# Patient Record
Sex: Male | Born: 1950 | Race: White | Hispanic: No | Marital: Married | Smoking: Never smoker
Health system: Southern US, Community
[De-identification: ages and names within clinical notes are randomized; demographics above are authoritative.]

## PROBLEM LIST (undated history)

## (undated) DIAGNOSIS — I10 Essential (primary) hypertension: Secondary | ICD-10-CM

## (undated) DIAGNOSIS — K219 Gastro-esophageal reflux disease without esophagitis: Secondary | ICD-10-CM

## (undated) DIAGNOSIS — C801 Malignant (primary) neoplasm, unspecified: Secondary | ICD-10-CM

## (undated) DIAGNOSIS — D229 Melanocytic nevi, unspecified: Secondary | ICD-10-CM

## (undated) DIAGNOSIS — N189 Chronic kidney disease, unspecified: Secondary | ICD-10-CM

## (undated) DIAGNOSIS — R011 Cardiac murmur, unspecified: Secondary | ICD-10-CM

## (undated) HISTORY — PX: OTHER SURGICAL HISTORY: SHX169

---
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
## 1979-01-18 HISTORY — PX: VASECTOMY: SHX75

## 2001-02-12 DIAGNOSIS — D229 Melanocytic nevi, unspecified: Secondary | ICD-10-CM

## 2001-02-12 HISTORY — DX: Melanocytic nevi, unspecified: D22.9

## 2014-12-24 ENCOUNTER — Other Ambulatory Visit: Payer: Self-pay | Admitting: Urology

## 2015-01-13 NOTE — Patient Instructions (Addendum)
Bobby Schroeder  01/13/2015   Your procedure is scheduled on:  01/26/2015    Report to [facility] Main  Entrance take [place]  elevators to 3rd floor to  [place] at    [employer] AM.  Call this number if you have problems the morning of surgery [phone]   Remember: ONLY 1 PERSON MAY GO WITH YOU TO SHORT STAY TO GET  READY MORNING OF YOUR SURGERY.  Do not eat food after midnight Saturday night.  Sunday morning may follow clear liquid diet until midnight Sunday night.  Then nothing by mouth.   CLEAR LIQUID DIET   Foods Allowed                                                                     Foods Excluded  Coffee and tea, regular and decaf                             liquids that you cannot  Plain Jell-O in any flavor                                             see through such as: Fruit ices (not with fruit pulp)                                     milk, soups, orange juice  Iced Popsicles                                    All solid food Carbonated beverages, regular and diet                                    Cranberry, grape and apple juices Sports drinks like Gatorade Lightly seasoned clear broth or consume(fat free) Sugar, honey syrup  Sample Menu Breakfast                                Lunch                                     Supper Cranberry juice                    Beef broth                            Chicken broth Jell-O                                     Grape juice  Apple juice Coffee or tea                        Jell-O                                      Popsicle                                                Coffee or tea                        Coffee or tea  _____________________________________________________________________       Take these medicines the morning of surgery with A SIP OF WATER: Doxazosin (Cardura), Allopurinol                                You may not have any metal on your body  including hair pins and              piercings  Do not wear jewelry,  lotions, powders or perfumes, deodorant                         Men may shave face and neck.   Do not bring valuables to the hospital. [place].  Contacts, dentures or bridgework may not be worn into surgery.  Leave suitcase in the car. After surgery it may be brought to your room.   Marland Kitchen  Special Instructions: coughing and deep breathing exercises, leg exercises               Please read over the following fact sheets you were given: _____________________________________________________________________             [facility] - Preparing for Surgery Before surgery, you can play an important role.  Because skin is not sterile, your skin needs to be as free of germs as possible.  You can reduce the number of germs on your skin by washing with CHG (chlorahexidine gluconate) soap before surgery.  CHG is an antiseptic cleaner which kills germs and bonds with the skin to continue killing germs even after washing. Please DO NOT use if you have an allergy to CHG or antibacterial soaps.  If your skin becomes reddened/irritated stop using the CHG and inform your nurse when you arrive at Short Stay. Do not shave (including legs and underarms) for at least 48 hours prior to the first CHG shower.  You may shave your face/neck. Please follow these instructions carefully:  1.  Shower with CHG Soap the night before surgery and the  morning of Surgery.  2.  If you choose to wash your hair, wash your hair first as usual with your  normal  shampoo.  3.  After you shampoo, rinse your hair and body thoroughly to remove the  shampoo.                           4.  Use CHG as you would any other liquid soap.  You can apply chg directly  to the skin and wash                       Gently with a scrungie or clean washcloth.  5.  Apply the CHG Soap to your body ONLY FROM THE NECK DOWN.   Do not use  on face/ open                           Wound or open sores. Avoid contact with eyes, ears mouth and genitals (private parts).                       Wash face,  Genitals (private parts) with your normal soap.             6.  Wash thoroughly, paying special attention to the area where your surgery  will be performed.  7.  Thoroughly rinse your body with warm water from the neck down.  8.  DO NOT shower/wash with your normal soap after using and rinsing off  the CHG Soap.                9.  Pat yourself dry with a clean towel.            10.  Wear clean pajamas.            11.  Place clean sheets on your bed the night of your first shower and do not  sleep with pets. Day of Surgery : Do not apply any lotions/deodorants the morning of surgery.  Please wear clean clothes to the hospital/surgery center.  FAILURE TO FOLLOW THESE INSTRUCTIONS MAY RESULT IN THE CANCELLATION OF YOUR SURGERY PATIENT SIGNATURE_________________________________  NURSE SIGNATURE__________________________________  ________________________________________________________________________  WHAT IS A BLOOD TRANSFUSION? Blood Transfusion Information  A transfusion is the replacement of blood or some of its parts. Blood is made up of multiple cells which provide different functions.  Red blood cells carry oxygen and are used for blood loss replacement.  White blood cells fight against infection.  Platelets control bleeding.  Plasma helps clot blood.  Other blood products are available for specialized needs, such as hemophilia or other clotting disorders. BEFORE THE TRANSFUSION  Who gives blood for transfusions?   Healthy volunteers who are fully evaluated to make sure their blood is safe. This is blood bank blood. Transfusion therapy is the safest it has ever been in the practice of medicine. Before blood is taken from a donor, a complete history is taken to make sure that person has no history of diseases nor engages  in risky social behavior (examples are intravenous drug use or sexual activity with multiple partners). The donor's travel history is screened to minimize risk of transmitting infections, such as malaria. The donated blood is tested for signs of infectious diseases, such as HIV and hepatitis. The blood is then tested to be sure it is compatible with you in order to minimize the chance of a transfusion reaction. If you or a relative donates blood, this is often done in anticipation of surgery and is not appropriate for emergency situations. It takes many days to process the donated blood. RISKS AND COMPLICATIONS Although transfusion therapy is very safe and saves many lives, the main dangers of transfusion include:  1. Getting an infectious disease. 2. Developing a transfusion reaction. This is an allergic reaction to something in the blood you were  given. Every precaution is taken to prevent this. The decision to have a blood transfusion has been considered carefully by your caregiver before blood is given. Blood is not given unless the benefits outweigh the risks. AFTER THE TRANSFUSION  Right after receiving a blood transfusion, you will usually feel much better and more energetic. This is especially true if your red blood cells have gotten low (anemic). The transfusion raises the level of the red blood cells which carry oxygen, and this usually causes an energy increase.  The nurse administering the transfusion will monitor you carefully for complications. HOME CARE INSTRUCTIONS  No special instructions are needed after a transfusion. You may find your energy is better. Speak with your caregiver about any limitations on activity for underlying diseases you may have. SEEK MEDICAL CARE IF:   Your condition is not improving after your transfusion.  You develop redness or irritation at the intravenous (IV) site. SEEK IMMEDIATE MEDICAL CARE IF:  Any of the following symptoms occur over the next 12  hours:  Shaking chills.  You have a temperature by mouth above 102 F (38.9 C), not controlled by medicine.  Chest, back, or muscle pain.  People around you feel you are not acting correctly or are confused.  Shortness of breath or difficulty breathing.  Dizziness and fainting.  You get a rash or develop hives.  You have a decrease in urine output.  Your urine turns a dark color or changes to pink, red, or brown. Any of the following symptoms occur over the next 10 days:  You have a temperature by mouth above 102 F (38.9 C), not controlled by medicine.  Shortness of breath.  Weakness after normal activity.  The white part of the eye turns yellow (jaundice).  You have a decrease in the amount of urine or are urinating less often.  Your urine turns a dark color or changes to pink, red, or brown. Document Released: 01/01/2000 Document Revised: 03/28/2011 Document Reviewed: 08/20/2007 ExitCare Patient Information 2014 [place].  _______________________________________________________________________  Incentive Spirometer  An incentive spirometer is a tool that can help keep your lungs clear and active. This tool measures how well you are filling your lungs with each breath. Taking long deep breaths may help reverse or decrease the chance of developing breathing (pulmonary) problems (especially infection) following:  A long period of time when you are unable to move or be active. BEFORE THE PROCEDURE   If the spirometer includes an indicator to show your best effort, your nurse or respiratory therapist will set it to a desired goal.  If possible, sit up straight or lean slightly forward. Try not to slouch.  Hold the incentive spirometer in an upright position. INSTRUCTIONS FOR USE  3. Sit on the edge of your bed if possible, or sit up as far as you can in bed or on a chair. 4. Hold the incentive spirometer in an upright position. 5. Breathe out  normally. 6. Place the mouthpiece in your mouth and seal your lips tightly around it. 7. Breathe in slowly and as deeply as possible, raising the piston or the ball toward the top of the column. 8. Hold your breath for 3-5 seconds or for as long as possible. Allow the piston or ball to fall to the bottom of the column. 9. Remove the mouthpiece from your mouth and breathe out normally. 10. Rest for a few seconds and repeat Steps 1 through 7 at least 10 times every 1-2 hours when you  are awake. Take your time and take a few normal breaths between deep breaths. 11. The spirometer may include an indicator to show your best effort. Use the indicator as a goal to work toward during each repetition. 12. After each set of 10 deep breaths, practice coughing to be sure your lungs are clear. If you have an incision (the cut made at the time of surgery), support your incision when coughing by placing a pillow or rolled up towels firmly against it. Once you are able to get out of bed, walk around indoors and cough well. You may stop using the incentive spirometer when instructed by your caregiver.  RISKS AND COMPLICATIONS  Take your time so you do not get dizzy or light-headed.  If you are in pain, you may need to take or ask for pain medication before doing incentive spirometry. It is harder to take a deep breath if you are having pain. AFTER USE  Rest and breathe slowly and easily.  It can be helpful to keep track of a log of your progress. Your caregiver can provide you with a simple table to help with this. If you are using the spirometer at home, follow these instructions: [place] IF:   You are having difficultly using the spirometer.  You have trouble using the spirometer as often as instructed.  Your pain medication is not giving enough relief while using the spirometer.  You develop fever of 100.5 F (38.1 C) or higher. SEEK IMMEDIATE MEDICAL CARE IF:   You cough up bloody sputum  that had not been present before.  You develop fever of 102 F (38.9 C) or greater.  You develop worsening pain at or near the incision site. MAKE SURE YOU:   Understand these instructions.  Will watch your condition.  Will get help right away if you are not doing well or get worse. Document Released: 05/16/2006 Document Revised: 03/28/2011 Document Reviewed: 07/17/2006 [facility] Patient Information 2014 [place], [place].   ________________________________________________________________________

## 2015-01-14 ENCOUNTER — Ambulatory Visit (HOSPITAL_COMMUNITY)
Admission: RE | Admit: 2015-01-14 | Discharge: 2015-01-14 | Disposition: A | Discharge status: Home / Self Care | Payer: Medicare Other | Source: Ambulatory Visit | Attending: Anesthesiology | Admitting: Anesthesiology

## 2015-01-14 ENCOUNTER — Encounter (HOSPITAL_COMMUNITY)
Admission: RE | Admit: 2015-01-14 | Discharge: 2015-01-14 | Disposition: A | Discharge status: Home / Self Care | Payer: Medicare Other | Source: Ambulatory Visit | Attending: Urology | Admitting: Urology

## 2015-01-14 ENCOUNTER — Encounter (HOSPITAL_COMMUNITY): Payer: Self-pay

## 2015-01-14 ENCOUNTER — Other Ambulatory Visit (HOSPITAL_COMMUNITY): Payer: Self-pay

## 2015-01-14 DIAGNOSIS — Z01818 Encounter for other preprocedural examination: Secondary | ICD-10-CM | POA: Diagnosis not present

## 2015-01-14 HISTORY — DX: Cardiac murmur, unspecified: R01.1

## 2015-01-14 HISTORY — DX: Malignant (primary) neoplasm, unspecified: C80.1

## 2015-01-14 HISTORY — DX: Gastro-esophageal reflux disease without esophagitis: K21.9

## 2015-01-14 HISTORY — DX: Chronic kidney disease, unspecified: N18.9

## 2015-01-14 HISTORY — DX: Essential (primary) hypertension: I10

## 2015-01-14 LAB — CBC
HCT: 44.3 % (ref 39.0–52.0)
Hemoglobin: 15 g/dL (ref 13.0–17.0)
MCH: 30.9 pg (ref 26.0–34.0)
MCHC: 33.9 g/dL (ref 30.0–36.0)
MCV: 91.2 fL (ref 78.0–100.0)
Platelets: 168 10*3/uL (ref 150–400)
RBC: 4.86 MIL/uL (ref 4.22–5.81)
RDW: 12.3 % (ref 11.5–15.5)
WBC: 4.3 10*3/uL (ref 4.0–10.5)

## 2015-01-14 LAB — TYPE AND SCREEN
ABO/RH(D): O POS
Antibody Screen: NEGATIVE

## 2015-01-14 LAB — BASIC METABOLIC PANEL
Anion gap: 9 (ref 5–15)
BUN: 12 mg/dL (ref 6–20)
CALCIUM: 9.3 mg/dL (ref 8.9–10.3)
CHLORIDE: 105 mmol/L (ref 101–111)
CO2: 28 mmol/L (ref 22–32)
CREATININE: 1.14 mg/dL (ref 0.61–1.24)
Glucose, Bld: 100 mg/dL — ABNORMAL HIGH (ref 65–99)
Potassium: 4 mmol/L (ref 3.5–5.1)
SODIUM: 142 mmol/L (ref 135–145)

## 2015-01-14 LAB — ABO/RH: ABO/RH(D): O POS

## 2015-01-14 NOTE — Progress Notes (Signed)
11-07-14 - Whole Body Bone Scan - [facility] - in chart 11-07-14 - CT ABD/Pelvis - [facility] - in chart

## 2015-01-23 NOTE — H&P (Signed)
Chief Complaint Prostate Cancer   Reason For Visit Reason for consult: To discuss treatment options for prostate cancer and specifically to consider a robotic prostatectomy.  Physician requesting consult: Dr. Roxanne Gates and Dr. Nila Nephew  PCP: Dr. Emelda Fear   History of Present Illness Bobby Schroeder is a 65 year old gentleman who was found to have an elevated PSA of 6.04 prompting urologic referral to Dr. Lerry Liner. He performed a TRUS biopsy of the prostate on 10/28/14 that confirmed Gleason 3+3=6 adenocarcinoma of the prostate in 7 out of 12 biopsy cores. Dr. Lerry Liner performed staging studies including a CT scan and bone scan on 11/07/14 which were negative for metastatic disease. His CT did incidentally note non-obstructing renal calculi. He has a family history of prostate cancer with his father having died of metastatic disease at age 81. He has been counseled by Dr. Lerry Liner and Dr. Danny Lawless and is well informed about his treatment options.    TNM stage: cT1c Nx Mx  PSA: 6.04  Gleason score: 3+3=6  Biopsy (10/28/14 - read by Dr. Casimer Schroeder, Acc # [id number]): 7/12 cores    Left: L apex (20%, 3+3=6, 1/2 cores), L mid (5%, 5%, 3+3=6)    Right: R apex (60%, 10%, 3+3=6), R mid (20%, 3+3=6), R base (5%, 3+3=6)  Prostate volume: 32.8 cc    Nomogram  OC disease: 45%  EPE: 54%  SVI: 3%  LNI: 2%  PFS (surgery): 92% at 5 years, 85% at 10 years    Urinary function: He does have bothersome symptoms including intermittency, weak stream, and straining. He has a history of BPH and has been treated with doxazosin 8 mg for a few years. He feels that his medication has become less effective over the past year. IPSS is 18 with a bother score of 4.  Erectile function: SHIM score is 3. This is primarily related to inactivity. He states that his sexual activity has been a very low priority for him and his wife for the past few years.   Past Medical History Problems  1. History of BPH  (benign prostatic hyperplasia) (N40.0) 2. History of gout (Z87.39) 3. History of hypertension (Z86.79) 4. History of urinary stone [id number])  Surgical History Problems  1. History of Renal Lithotripsy 2. History of Surgery Vas Deferens Vasectomy  Current Meds 1. Allopurinol 300 MG Oral Tablet;  Therapy: (Recorded:06Dec2016) to Recorded 2. Doxazosin Mesylate 8 MG Oral Tablet;  Therapy: (Recorded:06Dec2016) to Recorded 3. Tamsulosin HCl - 0.4 MG Oral Capsule;  Therapy: (Recorded:06Dec2016) to Recorded  Allergies Medication  1. No Known Drug Allergies  Family History Problems  1. Family history of prostate cancer (Z80.42) : Father  Social History Problems    Denied: History of Alcohol use   Married   Never a smoker  Review of Systems Genitourinary, constitutional, skin, eye, otolaryngeal, hematologic/lymphatic, cardiovascular, pulmonary, endocrine, musculoskeletal, gastrointestinal, neurological and psychiatric system(s) were reviewed and pertinent findings if present are noted and are otherwise negative.  Gastrointestinal: heartburn, diarrhea and constipation.    Vitals Vital Signs [Data Includes: Last 1 Day]  Recorded: [id number] 09:29AM  Height: 5 ft 9 in Weight: 170 lb  BMI Calculated: 25.1 BSA Calculated: 1.93 Blood Pressure: 147 / 79 Heart Rate: 71  Physical Exam Constitutional: Well nourished and well developed . No acute distress.  ENT:. The ears and nose are normal in appearance.  Neck: The appearance of the neck is normal and no neck mass is present.  Pulmonary: No respiratory distress, normal respiratory  rhythm and effort and clear bilateral breath sounds.  Cardiovascular: Heart rate and rhythm are normal . No peripheral edema.  Abdomen: The abdomen is soft and nontender. No masses are palpated. No CVA tenderness. No hernias are palpable. No hepatosplenomegaly noted.  Rectal: Rectal exam demonstrates normal sphincter tone, no tenderness and no masses.  Prostate size is estimated to be 40 g. The prostate has no nodularity and is not tender. The left seminal vesicle is nonpalpable. The right seminal vesicle is nonpalpable. The perineum is normal on inspection.  Lymphatics: The femoral and inguinal nodes are not enlarged or tender.  Skin: Normal skin turgor, no visible rash and no visible skin lesions.  Neuro/Psych:. Mood and affect are appropriate.    Results/Data Urine [Data Includes: Last 1 Day]   [id number]  COLOR YELLOW   APPEARANCE CLEAR   SPECIFIC GRAVITY 1.025   pH 5.5   GLUCOSE NEGATIVE   BILIRUBIN NEGATIVE   KETONE NEGATIVE   BLOOD 1+   PROTEIN NEGATIVE   NITRITE NEGATIVE   LEUKOCYTE ESTERASE NEGATIVE   SQUAMOUS EPITHELIAL/HPF 0-5 HPF  WBC 0-5 WBC/HPF  RBC 0-2 RBC/HPF  BACTERIA NONE SEEN HPF  CRYSTALS NONE SEEN HPF  CASTS NONE SEEN LPF  Other    Yeast NONE SEEN HPF   I have reviewed his medical records, PSA results, and pathology results. Findings are as dictated above.   Plan Health Maintenance  1. UA With REFLEX; [Do Not Release]; Status:Complete;   [id number] 09:21AM  Discussion/Summary 1. Prostate cancer: Bobby Schroeder feels very well informed about his treatment options. After further discussion, he is most interested in proceeding with surgical therapy primarily due to his baseline urinary symptoms.   The patient was counseled about the natural history of prostate cancer and the standard treatment options that are available for prostate cancer. It was explained to him how his age and life expectancy, clinical stage, Gleason score, and PSA affect his prognosis, the decision to proceed with additional staging studies, as well as how that information influences recommended treatment strategies. We discussed the roles for active surveillance, radiation therapy, surgical therapy, androgen deprivation, as well as ablative therapy options for the treatment of prostate cancer as appropriate to his individual cancer  situation. We discussed the risks and benefits of these options with regard to their impact on cancer control and also in terms of potential adverse events, complications, and impact on quiality of life particularly related to urinary, bowel, and sexual function. The patient was encouraged to ask questions throughout the discussion today and all questions were answered to his stated satisfaction. In addition, the patient was provided with and/or directed to appropriate resources and literature for further education about prostate cancer and treatment options.   We discussed surgical therapy for prostate cancer including the different available surgical approaches. We discussed, in detail, the risks and expectations of surgery with regard to cancer control, urinary control, and erectile function as well as the expected postoperative recovery process. Additional risks of surgery including but not limited to bleeding, infection, hernia formation, nerve damage, lymphocele formation, bowel/rectal injury potentially necessitating colostomy, damage to the urinary tract resulting in urine leakage, urethral stricture, and the cardiopulmonary risks such as myocardial infarction, stroke, death, venothromboembolism, etc. were explained. The risk of open surgical conversion for robotic/laparoscopic prostatectomy was also discussed.     He will tentatively be scheduled for a bilateral nerve sparing robot-assisted laparoscopic radical prostatectomy.    Cc: Dr. Emelda Fear  Dr. Nila Nephew  Dr. Alain Marion  A total of 65 minutes were spent in the overall care of the patient today with 45 minutes in direct face to face consultation.    Signatures Electronically signed by : Raynelle Bring, M.D.; Dec 23 2014 10:31AM EST  Chief Complaint Prostate Cancer    History of Present Illness Bobby Schroeder is a 65 year old gentleman who was found to have an elevated PSA of 6.04 prompting urologic referral to Dr. Lerry Liner.  He performed a TRUS biopsy of the prostate on 10/28/14 that confirmed Gleason 3+3=6 adenocarcinoma of the prostate in 7 out of 12 biopsy cores. Dr. Lerry Liner performed staging studies including a CT scan and bone scan on 11/07/14 which were negative for metastatic disease. His CT did incidentally note non-obstructing renal calculi. He has a family history of prostate cancer with his father having died of metastatic disease at age 85. He has been counseled by Dr. Lerry Liner and Dr. Danny Lawless and is well informed about his treatment options.    TNM stage: cT1c Nx Mx  PSA: 6.04  Gleason score: 3+3=6  Biopsy (10/28/14 - read by Dr. Casimer Schroeder, Acc # [id number]): 7/12 cores    Left: L apex (20%, 3+3=6, 1/2 cores), L mid (5%, 5%, 3+3=6)    Right: R apex (60%, 10%, 3+3=6), R mid (20%, 3+3=6), R base (5%, 3+3=6)  Prostate volume: 32.8 cc    Nomogram  OC disease: 45%  EPE: 54%  SVI: 3%  LNI: 2%  PFS (surgery): 92% at 5 years, 85% at 10 years    Urinary function: He does have bothersome symptoms including intermittency, weak stream, and straining. He has a history of BPH and has been treated with doxazosin 8 mg for a few years. He feels that his medication has become less effective over the past year. IPSS is 18 with a bother score of 4.  Erectile function: SHIM score is 3. This is primarily related to inactivity. He states that his sexual activity has been a very low priority for him and his wife for the past few years.   Past Medical History Problems  1. History of BPH (benign prostatic hyperplasia) (N40.0) 2. History of gout (Z87.39) 3. History of hypertension (Z86.79) 4. History of urinary stone [id number])  Surgical History Problems  1. History of Renal Lithotripsy 2. History of Surgery Vas Deferens Vasectomy  Current Meds 1. Allopurinol 300 MG Oral Tablet;  Therapy: (Recorded:06Dec2016) to Recorded 2. Doxazosin Mesylate 8 MG Oral Tablet;  Therapy: (Recorded:06Dec2016) to  Recorded 3. Tamsulosin HCl - 0.4 MG Oral Capsule;  Therapy: (Recorded:06Dec2016) to Recorded  Allergies Medication  1. No Known Drug Allergies  Family History Problems  1. Family history of prostate cancer (Z80.42) : Father  Social History Problems    Denied: History of Alcohol use   Married   Never a smoker  Review of Systems Genitourinary, constitutional, skin, eye, otolaryngeal, hematologic/lymphatic, cardiovascular, pulmonary, endocrine, musculoskeletal, gastrointestinal, neurological and psychiatric system(s) were reviewed and pertinent findings if present are noted and are otherwise negative.  Gastrointestinal: heartburn, diarrhea and constipation.    Vitals  Height: 5 ft 9 in Weight: 170 lb  BMI Calculated: 25.1   Physical Exam Constitutional: Well nourished and well developed . No acute distress.  ENT:. The ears and nose are normal in appearance.  Neck: The appearance of the neck is normal and no neck mass is present.  Pulmonary: No respiratory distress, normal respiratory rhythm and effort and clear bilateral breath sounds.  Cardiovascular: Heart  rate and rhythm are normal . No peripheral edema.  Abdomen: The abdomen is soft and nontender. No masses are palpated. No CVA tenderness. No hernias are palpable. No hepatosplenomegaly noted.    Discussion/Summary 1. Prostate cancer: Bobby Schroeder will undergo a bilateral nerve sparing robot-assisted laparoscopic radical prostatectomy.

## 2015-01-25 NOTE — Anesthesia Preprocedure Evaluation (Addendum)
Anesthesia Evaluation  Patient identified by MRN, date of birth, ID band Patient awake    Reviewed: Allergy & Precautions, NPO status , Patient's Chart, lab work & pertinent test results  Airway Mallampati: II  TM Distance: >3 FB Neck ROM: Full    Dental  (+) Dental Advisory Given   Pulmonary neg pulmonary ROS,    breath sounds clear to auscultation       Cardiovascular hypertension,  Rhythm:Regular Rate:Normal     Neuro/Psych negative neurological ROS     GI/Hepatic Neg liver ROS, GERD  ,  Endo/Other  negative endocrine ROS  Renal/GU Renal InsufficiencyRenal disease     Musculoskeletal   Abdominal   Peds  Hematology negative hematology ROS (+)   Anesthesia Other Findings   Reproductive/Obstetrics                               Component Value Date/Time   WBC 4.3 01/14/2015 1205   RBC 4.86 01/14/2015 1205   HGB 15.0 01/14/2015 1205   HCT 44.3 01/14/2015 1205   PLT 168 01/14/2015 1205      Component Value Date/Time   NA 142 01/14/2015 1205   K 4.0 01/14/2015 1205   CL 105 01/14/2015 1205   CO2 28 01/14/2015 1205   BUN 12 01/14/2015 1205   CREATININE 1.14 01/14/2015 1205   CALCIUM 9.3 01/14/2015 1205      Anesthesia Physical Anesthesia Plan  ASA: II  Anesthesia Plan: General   Post-op Pain Management:    Induction: Intravenous  Airway Management Planned: Oral ETT  Additional Equipment:   Intra-op Plan:   Post-operative Plan: Extubation in OR  Informed Consent: I have reviewed the patients History and Physical, chart, labs and discussed the procedure including the risks, benefits and alternatives for the proposed anesthesia with the patient or authorized representative who has indicated his/her understanding and acceptance.   Dental advisory given  Plan Discussed with: CRNA  Anesthesia Plan Comments:        Anesthesia Quick Evaluation

## 2015-01-26 ENCOUNTER — Inpatient Hospital Stay (HOSPITAL_COMMUNITY): Payer: Medicare Other | Admitting: Anesthesiology

## 2015-01-26 ENCOUNTER — Encounter (HOSPITAL_COMMUNITY)
Admission: RE | Disposition: A | Discharge status: Home / Self Care | Payer: Self-pay | Source: Ambulatory Visit | Attending: Urology

## 2015-01-26 ENCOUNTER — Encounter (HOSPITAL_COMMUNITY): Payer: Self-pay | Admitting: Certified Registered Nurse Anesthetist

## 2015-01-26 ENCOUNTER — Inpatient Hospital Stay (HOSPITAL_COMMUNITY)
Admission: RE | Admit: 2015-01-26 | Discharge: 2015-01-27 | DRG: 708 | Disposition: A | Discharge status: Home / Self Care | Payer: Medicare Other | Source: Ambulatory Visit | Attending: Urology | Admitting: Urology

## 2015-01-26 DIAGNOSIS — I10 Essential (primary) hypertension: Secondary | ICD-10-CM | POA: Diagnosis present

## 2015-01-26 DIAGNOSIS — N400 Enlarged prostate without lower urinary tract symptoms: Secondary | ICD-10-CM | POA: Diagnosis present

## 2015-01-26 DIAGNOSIS — Z23 Encounter for immunization: Secondary | ICD-10-CM

## 2015-01-26 DIAGNOSIS — Z79899 Other long term (current) drug therapy: Secondary | ICD-10-CM

## 2015-01-26 DIAGNOSIS — R9721 Rising PSA following treatment for malignant neoplasm of prostate: Secondary | ICD-10-CM | POA: Diagnosis present

## 2015-01-26 DIAGNOSIS — R3912 Poor urinary stream: Secondary | ICD-10-CM | POA: Diagnosis present

## 2015-01-26 DIAGNOSIS — C61 Malignant neoplasm of prostate: Secondary | ICD-10-CM | POA: Diagnosis present

## 2015-01-26 DIAGNOSIS — Z8042 Family history of malignant neoplasm of prostate: Secondary | ICD-10-CM

## 2015-01-26 HISTORY — PX: ROBOT ASSISTED LAPAROSCOPIC RADICAL PROSTATECTOMY: SHX5141

## 2015-01-26 LAB — HEMOGLOBIN AND HEMATOCRIT, BLOOD
HCT: 39.4 % (ref 39.0–52.0)
HEMOGLOBIN: 13 g/dL (ref 13.0–17.0)

## 2015-01-26 SURGERY — ROBOTIC ASSISTED LAPAROSCOPIC RADICAL PROSTATECTOMY LEVEL 1
Anesthesia: General

## 2015-01-26 MED ORDER — MIDAZOLAM HCL 2 MG/2ML IJ SOLN
INTRAMUSCULAR | Status: AC
  Filled 2015-01-26: qty 2

## 2015-01-26 MED ORDER — KCL IN DEXTROSE-NACL 20-5-0.45 MEQ/L-%-% IV SOLN
INTRAVENOUS | Status: AC
  Filled 2015-01-26: qty 1000

## 2015-01-26 MED ORDER — SUGAMMADEX SODIUM 200 MG/2ML IV SOLN
INTRAVENOUS | Status: DC | PRN
  Administered 2015-01-26: 160 mg via INTRAVENOUS

## 2015-01-26 MED ORDER — HYDROMORPHONE HCL 1 MG/ML IJ SOLN: 0.2500 mg | INTRAMUSCULAR | Status: DC | PRN

## 2015-01-26 MED ORDER — PROMETHAZINE HCL 25 MG/ML IJ SOLN: 6.2500 mg | INTRAMUSCULAR | Status: DC | PRN

## 2015-01-26 MED ORDER — SODIUM CHLORIDE 0.9 % IR SOLN
Status: DC | PRN
  Administered 2015-01-26: 1 via INTRAVESICAL

## 2015-01-26 MED ORDER — ONDANSETRON HCL 4 MG/2ML IJ SOLN
4.0000 mg | Freq: Three times a day (TID) | INTRAMUSCULAR | Status: DC | PRN
Start: 2015-01-26 — End: 2015-01-27
  Administered 2015-01-26: 4 mg via INTRAVENOUS
  Filled 2015-01-26: qty 2

## 2015-01-26 MED ORDER — DIPHENHYDRAMINE HCL 12.5 MG/5ML PO ELIX: 12.5000 mg | ORAL_SOLUTION | Freq: Four times a day (QID) | ORAL | Status: DC | PRN

## 2015-01-26 MED ORDER — EPHEDRINE SULFATE 50 MG/ML IJ SOLN
INTRAMUSCULAR | Status: DC | PRN
  Administered 2015-01-26: 5 mg via INTRAVENOUS
  Administered 2015-01-26 (×2): 10 mg via INTRAVENOUS
  Administered 2015-01-26: 5 mg via INTRAVENOUS
  Administered 2015-01-26 (×2): 10 mg via INTRAVENOUS

## 2015-01-26 MED ORDER — PROPOFOL 10 MG/ML IV BOLUS
INTRAVENOUS | Status: DC | PRN
  Administered 2015-01-26: 150 mg via INTRAVENOUS

## 2015-01-26 MED ORDER — KETOROLAC TROMETHAMINE 15 MG/ML IJ SOLN
15.0000 mg | Freq: Four times a day (QID) | INTRAMUSCULAR | Status: DC
  Administered 2015-01-26 – 2015-01-27 (×5): 15 mg via INTRAVENOUS
  Filled 2015-01-26 (×4): qty 1

## 2015-01-26 MED ORDER — SODIUM CHLORIDE 0.9 % IJ SOLN
INTRAMUSCULAR | Status: AC
Start: 2015-01-26 — End: 2015-01-26
  Filled 2015-01-26: qty 10

## 2015-01-26 MED ORDER — ALLOPURINOL 300 MG PO TABS
300.0000 mg | ORAL_TABLET | Freq: Every day | ORAL | Status: DC
  Administered 2015-01-26 – 2015-01-27 (×2): 300 mg via ORAL
  Filled 2015-01-26 (×3): qty 1

## 2015-01-26 MED ORDER — SUGAMMADEX SODIUM 200 MG/2ML IV SOLN
INTRAVENOUS | Status: AC
  Filled 2015-01-26: qty 2

## 2015-01-26 MED ORDER — HYDROMORPHONE HCL 1 MG/ML IJ SOLN
INTRAMUSCULAR | Status: AC
  Filled 2015-01-26: qty 1

## 2015-01-26 MED ORDER — SODIUM CHLORIDE 0.9 % IJ SOLN
INTRAMUSCULAR | Status: AC
  Filled 2015-01-26: qty 10

## 2015-01-26 MED ORDER — STERILE WATER FOR IRRIGATION IR SOLN
Status: DC | PRN
  Administered 2015-01-26: 1000 mL

## 2015-01-26 MED ORDER — ONDANSETRON HCL 4 MG/2ML IJ SOLN
INTRAMUSCULAR | Status: DC | PRN
  Administered 2015-01-26: 4 mg via INTRAVENOUS

## 2015-01-26 MED ORDER — OXYCODONE HCL 5 MG/5ML PO SOLN
5.0000 mg | Freq: Once | ORAL | Status: DC | PRN
  Filled 2015-01-26: qty 5

## 2015-01-26 MED ORDER — INFLUENZA VAC SPLIT QUAD 0.5 ML IM SUSY
0.5000 mL | PREFILLED_SYRINGE | INTRAMUSCULAR | Status: AC
  Administered 2015-01-27: 0.5 mL via INTRAMUSCULAR
  Filled 2015-01-26 (×2): qty 0.5

## 2015-01-26 MED ORDER — HEPARIN SODIUM (PORCINE) 1000 UNIT/ML IJ SOLN
INTRAMUSCULAR | Status: AC
  Filled 2015-01-26: qty 1

## 2015-01-26 MED ORDER — EPHEDRINE SULFATE 50 MG/ML IJ SOLN
INTRAMUSCULAR | Status: AC
  Filled 2015-01-26: qty 1

## 2015-01-26 MED ORDER — FENTANYL CITRATE (PF) 100 MCG/2ML IJ SOLN
INTRAMUSCULAR | Status: DC | PRN
  Administered 2015-01-26 (×5): 50 ug via INTRAVENOUS

## 2015-01-26 MED ORDER — PROPOFOL 10 MG/ML IV BOLUS
INTRAVENOUS | Status: AC
  Filled 2015-01-26: qty 20

## 2015-01-26 MED ORDER — LACTATED RINGERS IV SOLN
INTRAVENOUS | Status: DC
  Administered 2015-01-26: 1000 mL via INTRAVENOUS

## 2015-01-26 MED ORDER — CEFAZOLIN SODIUM-DEXTROSE 2-3 GM-% IV SOLR
2.0000 g | INTRAVENOUS | Status: AC
  Administered 2015-01-26: 2 g via INTRAVENOUS

## 2015-01-26 MED ORDER — BUPIVACAINE-EPINEPHRINE 0.25% -1:200000 IJ SOLN
INTRAMUSCULAR | Status: DC | PRN
  Administered 2015-01-26: 30 mL

## 2015-01-26 MED ORDER — SUCCINYLCHOLINE CHLORIDE 20 MG/ML IJ SOLN
INTRAMUSCULAR | Status: DC | PRN
  Administered 2015-01-26: 100 mg via INTRAVENOUS

## 2015-01-26 MED ORDER — SODIUM CHLORIDE 0.9 % IV BOLUS (SEPSIS)
1000.0000 mL | Freq: Once | INTRAVENOUS | Status: AC
  Administered 2015-01-26: 1000 mL via INTRAVENOUS

## 2015-01-26 MED ORDER — PHENYLEPHRINE HCL 10 MG/ML IJ SOLN
INTRAMUSCULAR | Status: AC
  Filled 2015-01-26: qty 1

## 2015-01-26 MED ORDER — FENTANYL CITRATE (PF) 100 MCG/2ML IJ SOLN
25.0000 ug | Freq: Once | INTRAMUSCULAR | Status: AC
  Administered 2015-01-26: 25 ug via INTRAVENOUS

## 2015-01-26 MED ORDER — ACETAMINOPHEN 325 MG PO TABS: 650.0000 mg | ORAL_TABLET | ORAL | Status: DC | PRN

## 2015-01-26 MED ORDER — HYDROMORPHONE HCL 1 MG/ML IJ SOLN
0.2500 mg | INTRAMUSCULAR | Status: DC | PRN
Start: 2015-01-26 — End: 2015-01-26
  Administered 2015-01-26 (×6): 0.5 mg via INTRAVENOUS

## 2015-01-26 MED ORDER — ROCURONIUM BROMIDE 100 MG/10ML IV SOLN
INTRAVENOUS | Status: DC | PRN
  Administered 2015-01-26: 10 mg via INTRAVENOUS
  Administered 2015-01-26: 40 mg via INTRAVENOUS

## 2015-01-26 MED ORDER — HYDROCODONE-ACETAMINOPHEN 5-325 MG PO TABS: 1.0000 | ORAL_TABLET | Freq: Four times a day (QID) | ORAL | Status: AC | PRN

## 2015-01-26 MED ORDER — FENTANYL CITRATE (PF) 100 MCG/2ML IJ SOLN
INTRAMUSCULAR | Status: AC
  Filled 2015-01-26: qty 2

## 2015-01-26 MED ORDER — DIPHENHYDRAMINE HCL 50 MG/ML IJ SOLN: 12.5000 mg | Freq: Four times a day (QID) | INTRAMUSCULAR | Status: DC | PRN

## 2015-01-26 MED ORDER — ARTIFICIAL TEARS OP OINT
TOPICAL_OINTMENT | OPHTHALMIC | Status: AC
  Filled 2015-01-26: qty 3.5

## 2015-01-26 MED ORDER — SULFAMETHOXAZOLE-TRIMETHOPRIM 800-160 MG PO TABS: 1.0000 | ORAL_TABLET | Freq: Two times a day (BID) | ORAL | Status: DC

## 2015-01-26 MED ORDER — HYDROMORPHONE HCL 1 MG/ML IJ SOLN
INTRAMUSCULAR | Status: DC | PRN
  Administered 2015-01-26 (×4): 0.5 mg via INTRAVENOUS

## 2015-01-26 MED ORDER — DOCUSATE SODIUM 100 MG PO CAPS
100.0000 mg | ORAL_CAPSULE | Freq: Two times a day (BID) | ORAL | Status: DC
  Administered 2015-01-26 – 2015-01-27 (×2): 100 mg via ORAL
  Filled 2015-01-26 (×2): qty 1

## 2015-01-26 MED ORDER — KCL IN DEXTROSE-NACL 20-5-0.45 MEQ/L-%-% IV SOLN
INTRAVENOUS | Status: DC
  Administered 2015-01-26 – 2015-01-27 (×2): via INTRAVENOUS
  Filled 2015-01-26 (×4): qty 1000

## 2015-01-26 MED ORDER — FENTANYL CITRATE (PF) 250 MCG/5ML IJ SOLN
INTRAMUSCULAR | Status: AC
  Filled 2015-01-26: qty 5

## 2015-01-26 MED ORDER — BUPIVACAINE-EPINEPHRINE (PF) 0.25% -1:200000 IJ SOLN
INTRAMUSCULAR | Status: AC
  Filled 2015-01-26: qty 30

## 2015-01-26 MED ORDER — LIDOCAINE HCL (CARDIAC) 20 MG/ML IV SOLN
INTRAVENOUS | Status: AC
  Filled 2015-01-26: qty 5

## 2015-01-26 MED ORDER — PHENYLEPHRINE HCL 10 MG/ML IJ SOLN
INTRAMUSCULAR | Status: DC | PRN
  Administered 2015-01-26 (×2): 80 ug via INTRAVENOUS

## 2015-01-26 MED ORDER — ROCURONIUM BROMIDE 100 MG/10ML IV SOLN
INTRAVENOUS | Status: AC
  Filled 2015-01-26: qty 1

## 2015-01-26 MED ORDER — ONDANSETRON HCL 4 MG/2ML IJ SOLN
INTRAMUSCULAR | Status: AC
  Filled 2015-01-26: qty 2

## 2015-01-26 MED ORDER — CEFAZOLIN SODIUM 1-5 GM-% IV SOLN
1.0000 g | Freq: Three times a day (TID) | INTRAVENOUS | Status: AC
  Administered 2015-01-26 (×2): 1 g via INTRAVENOUS
  Filled 2015-01-26 (×2): qty 50

## 2015-01-26 MED ORDER — LACTATED RINGERS IV SOLN
INTRAVENOUS | Status: DC | PRN
  Administered 2015-01-26: 08:00:00

## 2015-01-26 MED ORDER — DEXAMETHASONE SODIUM PHOSPHATE 10 MG/ML IJ SOLN
INTRAMUSCULAR | Status: AC
  Filled 2015-01-26: qty 1

## 2015-01-26 MED ORDER — DEXAMETHASONE SODIUM PHOSPHATE 10 MG/ML IJ SOLN
INTRAMUSCULAR | Status: DC | PRN
Start: 2015-01-26 — End: 2015-01-26
  Administered 2015-01-26: 10 mg via INTRAVENOUS

## 2015-01-26 MED ORDER — DOXAZOSIN MESYLATE 4 MG PO TABS
8.0000 mg | ORAL_TABLET | Freq: Every day | ORAL | Status: DC
  Filled 2015-01-26: qty 2

## 2015-01-26 MED ORDER — CEFAZOLIN SODIUM-DEXTROSE 2-3 GM-% IV SOLR
INTRAVENOUS | Status: AC
  Filled 2015-01-26: qty 50

## 2015-01-26 MED ORDER — OXYCODONE HCL 5 MG PO TABS: 5.0000 mg | ORAL_TABLET | Freq: Once | ORAL | Status: DC | PRN

## 2015-01-26 MED ORDER — LACTATED RINGERS IV SOLN
INTRAVENOUS | Status: DC | PRN
  Administered 2015-01-26 (×2): via INTRAVENOUS

## 2015-01-26 MED ORDER — LIDOCAINE HCL (CARDIAC) 20 MG/ML IV SOLN
INTRAVENOUS | Status: DC | PRN
  Administered 2015-01-26: 100 mg via INTRAVENOUS

## 2015-01-26 MED ORDER — MORPHINE SULFATE (PF) 2 MG/ML IV SOLN
2.0000 mg | INTRAVENOUS | Status: DC | PRN
  Administered 2015-01-26 (×2): 2 mg via INTRAVENOUS
  Filled 2015-01-26 (×2): qty 1

## 2015-01-26 MED ORDER — PNEUMOCOCCAL VAC POLYVALENT 25 MCG/0.5ML IJ INJ
0.5000 mL | INJECTION | INTRAMUSCULAR | Status: AC
  Administered 2015-01-27: 0.5 mL via INTRAMUSCULAR
  Filled 2015-01-26 (×2): qty 0.5

## 2015-01-26 MED ORDER — MIDAZOLAM HCL 5 MG/5ML IJ SOLN
INTRAMUSCULAR | Status: DC | PRN
  Administered 2015-01-26: 2 mg via INTRAVENOUS

## 2015-01-26 MED ORDER — SODIUM CHLORIDE 0.9 % IV SOLN
10.0000 mg | INTRAVENOUS | Status: DC | PRN
  Administered 2015-01-26: 25 ug/min via INTRAVENOUS

## 2015-01-26 MED ORDER — KETOROLAC TROMETHAMINE 30 MG/ML IJ SOLN
INTRAMUSCULAR | Status: AC
  Filled 2015-01-26: qty 1

## 2015-01-26 MED ORDER — HYDROMORPHONE HCL 2 MG/ML IJ SOLN
INTRAMUSCULAR | Status: AC
  Filled 2015-01-26: qty 1

## 2015-01-26 SURGICAL SUPPLY — 53 items
APPLICATOR COTTON TIP 6IN STRL (MISCELLANEOUS) ×3 IMPLANT
CATH FOLEY 2WAY SLVR 18FR 30CC (CATHETERS) ×3 IMPLANT
CATH ROBINSON RED A/P 16FR (CATHETERS) ×3 IMPLANT
CATH ROBINSON RED A/P 8FR (CATHETERS) ×3 IMPLANT
CATH TIEMANN FOLEY 18FR 5CC (CATHETERS) ×3 IMPLANT
CHLORAPREP W/TINT 26ML (MISCELLANEOUS) ×3 IMPLANT
CLIP LIGATING HEM O LOK PURPLE (MISCELLANEOUS) ×6 IMPLANT
COVER SURGICAL LIGHT HANDLE (MISCELLANEOUS) ×6 IMPLANT
COVER TIP SHEARS 8 DVNC (MISCELLANEOUS) ×2 IMPLANT
COVER TIP SHEARS 8MM DA VINCI (MISCELLANEOUS) ×4
CUTTER ECHEON FLEX ENDO 45 340 (ENDOMECHANICALS) ×3 IMPLANT
DECANTER SPIKE VIAL GLASS SM (MISCELLANEOUS) IMPLANT
DRAPE ARM DVNC X/XI (DISPOSABLE) ×4 IMPLANT
DRAPE COLUMN DVNC XI (DISPOSABLE) ×1 IMPLANT
DRAPE DA VINCI XI ARM (DISPOSABLE) ×8
DRAPE DA VINCI XI COLUMN (DISPOSABLE) ×2
DRAPE SURG IRRIG POUCH 19X23 (DRAPES) ×3 IMPLANT
DRSG TEGADERM 4X4.75 (GAUZE/BANDAGES/DRESSINGS) ×3 IMPLANT
ELECT REM PT RETURN 9FT ADLT (ELECTROSURGICAL) ×3
ELECTRODE REM PT RTRN 9FT ADLT (ELECTROSURGICAL) ×1 IMPLANT
GAUZE SPONGE 2X2 8PLY STRL LF (GAUZE/BANDAGES/DRESSINGS) ×1 IMPLANT
GLOVE BIO SURGEON STRL SZ 6.5 (GLOVE) ×2 IMPLANT
GLOVE BIO SURGEONS STRL SZ 6.5 (GLOVE) ×1
GLOVE BIOGEL M STRL SZ7.5 (GLOVE) ×6 IMPLANT
GOWN STRL REUS W/TWL LRG LVL3 (GOWN DISPOSABLE) ×9 IMPLANT
HOLDER FOLEY CATH W/STRAP (MISCELLANEOUS) ×3 IMPLANT
IV LACTATED RINGERS 1000ML (IV SOLUTION) ×3 IMPLANT
LIQUID BAND (GAUZE/BANDAGES/DRESSINGS) ×3 IMPLANT
NDL SAFETY ECLIPSE 18X1.5 (NEEDLE) ×1 IMPLANT
NEEDLE HYPO 18GX1.5 SHARP (NEEDLE) ×2
PACK ROBOT UROLOGY CUSTOM (CUSTOM PROCEDURE TRAY) ×3 IMPLANT
RELOAD GREEN ECHELON 45 (STAPLE) ×3 IMPLANT
SEAL CANN UNIV 5-8 DVNC XI (MISCELLANEOUS) ×4 IMPLANT
SEAL XI 5MM-8MM UNIVERSAL (MISCELLANEOUS) ×8
SET TUBE IRRIG SUCTION NO TIP (IRRIGATION / IRRIGATOR) ×3 IMPLANT
SOLUTION ELECTROLUBE (MISCELLANEOUS) ×3 IMPLANT
SPONGE GAUZE 2X2 STER 10/PKG (GAUZE/BANDAGES/DRESSINGS) ×2
SUT ETHILON 3 0 PS 1 (SUTURE) ×3 IMPLANT
SUT MNCRL 3 0 RB1 (SUTURE) ×1 IMPLANT
SUT MNCRL 3 0 VIOLET RB1 (SUTURE) ×1 IMPLANT
SUT MNCRL AB 4-0 PS2 18 (SUTURE) ×6 IMPLANT
SUT MONOCRYL 3 0 RB1 (SUTURE) ×4
SUT VIC AB 0 CT1 36 (SUTURE) ×3 IMPLANT
SUT VIC AB 0 UR5 27 (SUTURE) ×3 IMPLANT
SUT VIC AB 2-0 SH 27 (SUTURE) ×2
SUT VIC AB 2-0 SH 27X BRD (SUTURE) ×1 IMPLANT
SUT VIC AB 3-0 SH 27 (SUTURE) ×4
SUT VIC AB 3-0 SH 27XBRD (SUTURE) ×2 IMPLANT
SUT VICRYL 0 UR6 27IN ABS (SUTURE) ×6 IMPLANT
SYR 27GX1/2 1ML LL SAFETY (SYRINGE) ×3 IMPLANT
TOWEL OR 17X26 10 PK STRL BLUE (TOWEL DISPOSABLE) ×3 IMPLANT
TOWEL OR NON WOVEN STRL DISP B (DISPOSABLE) ×3 IMPLANT
WATER STERILE IRR 1500ML POUR (IV SOLUTION) ×6 IMPLANT

## 2015-01-26 NOTE — Progress Notes (Signed)
Utilization review completed.  

## 2015-01-26 NOTE — Progress Notes (Signed)
No bed available in main [facility] .  Pt transported from [facility] to main PACU at request of MAIN PACU CHARGE nurse.

## 2015-01-26 NOTE — Progress Notes (Signed)
Post-op note  Subjective: The patient is doing well.  No complaints. Denies pain, n/v. Post-op labs reviewed and stable.  Objective: Vital signs in last 24 hours: Temp:  [97.7 F (36.5 C)-98.3 F (36.8 C)] 98.3 F (36.8 C) (01/09 1400) Pulse Rate:  [88-100] 95 (01/09 1400) Resp:  [8-19] 16 (01/09 1400) BP: (110-147)/(68-83) 110/76 mmHg (01/09 1400) SpO2:  [97 %-100 %] 99 % (01/09 1400) Weight:  [77.565 kg (171 lb)] 77.565 kg (171 lb) (01/09 0533)  Intake/Output from previous day:   Intake/Output this shift: Total I/O In: 2400 [P.O.:200; I.V.:2200] Out: 435 [Urine:260; Drains:100; Blood:75]  Physical Exam:  General: Alert and oriented. Abdomen: Soft, Nondistended. Incisions: Clean and dry. GU: foley, clear JP: serosang 100 mL output since surgery  Lab Results:  Recent Labs  01/26/15 1055  HGB 13.0  HCT 39.4    Assessment/Plan: POD#0 s/p RALP  1) Continue to monitor     LOS: 0 days   Star Age 01/26/2015, 3:29 PM

## 2015-01-26 NOTE — Anesthesia Procedure Notes (Signed)
Procedure Name: Intubation Date/Time: 01/26/2015 7:23 AM Performed by: Maxwell Caul Pre-anesthesia Checklist: Patient identified, Emergency Drugs available, Suction available and Patient being monitored Patient Re-evaluated:Patient Re-evaluated prior to inductionOxygen Delivery Method: Circle System Utilized Preoxygenation: Pre-oxygenation with 100% oxygen Intubation Type: IV induction Ventilation: Mask ventilation without difficulty Laryngoscope Size: Mac and 4 Grade View: Grade I Tube type: Oral Tube size: 7.5 mm Number of attempts: 1 Airway Equipment and Method: Stylet Placement Confirmation: ETT inserted through vocal cords under direct vision,  positive ETCO2 and breath sounds checked- equal and bilateral Secured at: 22 cm Tube secured with: Tape Dental Injury: Teeth and Oropharynx as per pre-operative assessment

## 2015-01-26 NOTE — Progress Notes (Signed)
PHARMACY NOTE -  ANTIBIOTIC RENAL DOSE ADJUSTMENT    Request received for Pharmacy to assist with antibiotic renal dose adjustment.   Patient has been initiated on Ancef for post-surgical prophylaxis.  SCr 1.14, estimated CrCl 65 ml/min  Current dosage is appropriate and need for further dosage adjustment appears unlikely at present.  Will sign off at this time.  Please reconsult if a change in clinical status warrants re-evaluation of dosage.  Reuel Boom, PharmD, BCPS Pager: [phone] 01/26/2015, 4:20 PM

## 2015-01-26 NOTE — Discharge Summary (Signed)
Date of admission: 01/26/2015  Date of discharge: 01/27/2015  Admission diagnosis: Prostate Cancer  Discharge diagnosis: Prostate Cancer  History and Physical: For full details, please see admission history and physical. Briefly, Bobby Schroeder is a 65 y.o. gentleman with localized prostate cancer.  After discussing management/treatment options, he elected to proceed with surgical treatment.  Hospital Course: Bobby Schroeder was taken to the operating room on 01/26/2015 and underwent a robotic assisted laparoscopic radical prostatectomy. He tolerated this procedure well and without complications. Postoperatively, he was able to be transferred to a regular hospital room following recovery from anesthesia.  He was able to begin ambulating the night of surgery. He remained hemodynamically stable overnight.  He had excellent urine output with appropriately minimal output from his pelvic drain and his pelvic drain was removed on POD #1.  He was transitioned to oral pain medication, tolerated a clear liquid diet, and had met all discharge criteria and was able to be discharged home later on POD#1.  Laboratory values:   Recent Labs  01/26/15 1055 01/27/15 0530  HGB 13.0 12.1*  HCT 39.4 35.4*    Disposition: Home  Discharge instruction: He was instructed to be ambulatory but to refrain from heavy lifting, strenuous activity, or driving. He was instructed on urethral catheter care.  Filed Vitals:   01/27/15 0146 01/27/15 0613  BP: 103/57 109/63  Pulse: 72 64  Temp: 98 F (36.7 C) 98.1 F (36.7 C)  Resp: 14 12    Physical Exam:  Vital signs in last 24 hours: Temp:  [98 F (36.7 C)-98.2 F (36.8 C)] 98.1 F (36.7 C) (01/10 4540) Pulse Rate:  [64-76] 64 (01/10 0613) Resp:  [12-14] 12 (01/10 9811) BP: (103-112)/(57-65) 109/63 mmHg (01/10 0613) SpO2:  [97 %-98 %] 98 % (01/10 9147) Constitutional:  Alert and oriented, No acute distress Cardiovascular: Regular rate and rhythm, No  JVD Respiratory: Normal respiratory effort, SORA GI: Abdomen is soft, nontender, nondistended, no abdominal masses Genitourinary: No CVAT. Foley draining clear yellow urine. Rectal: deferred Neurologic: Grossly intact, no focal deficits Psychiatric: Normal mood and affect  Discharge medications:     Medication List    TAKE these medications        allopurinol 300 MG tablet  Commonly known as:  ZYLOPRIM  Take 300 mg by mouth daily.     doxazosin 8 MG tablet  Commonly known as:  CARDURA  Take 8 mg by mouth daily.     HYDROcodone-acetaminophen 5-325 MG tablet  Commonly known as:  NORCO/VICODIN  Take 1-2 tablets by mouth every 6 (six) hours as needed.     sulfamethoxazole-trimethoprim 800-160 MG tablet  Commonly known as:  BACTRIM DS  Take 1 tablet by mouth 2 (two) times daily. Start taking day prior to follow-up appointment        Followup: He will followup in 1 week for catheter removal and to discuss his surgical pathology results.

## 2015-01-26 NOTE — Op Note (Signed)
Preoperative diagnosis: Clinically localized adenocarcinoma of the prostate (clinical stage T1c Nx Mx)  Postoperative diagnosis: Clinically localized adenocarcinoma of the prostate (clinical stage T1c Nx Mx)  Procedure:  1. Robotic assisted laparoscopic radical prostatectomy (bilateral nerve sparing)  Surgeon: Roxy Horseman, Brooke Bonito. M.D.  Assistant: Debbrah Alar, PA-C  Resident: Dr. Verdis Frederickson  Anesthesia: General  EBL: 75 mL  IVF:  1000 mL crystalloid  Specimens: 1. Prostate and seminal vesicles  Disposition of specimens: Pathology  Drains: 1. 20 Fr coude catheter 2. # 19 Blake pelvic drain  Indication: Bobby Schroeder is a 65 y.o. year old patient with clinically localized prostate cancer.  After a thorough review of the management options for treatment of prostate cancer, he elected to proceed with surgical therapy and the above procedure(s).  We have discussed the potential benefits and risks of the procedure, side effects of the proposed treatment, the likelihood of the patient achieving the goals of the procedure, and any potential problems that might occur during the procedure or recuperation. Informed consent has been obtained.  Description of procedure:  The patient was taken to the operating room and a general anesthetic was administered. He was given preoperative antibiotics, placed in the dorsal lithotomy position, and prepped and draped in the usual sterile fashion. Next a preoperative timeout was performed. A urethral catheter was placed into the bladder and a site was selected near the umbilicus for placement of the camera port. This was placed using a standard open Hassan technique which allowed entry into the peritoneal cavity under direct vision and without difficulty. A 12 mm port was placed and a pneumoperitoneum established. The camera was then used to inspect the abdomen and there was no evidence of any intra-abdominal injuries or other abnormalities. The  remaining abdominal ports were then placed. 8 mm robotic ports were placed in the right lower quadrant, left lower quadrant, and far left lateral abdominal wall. A 5 mm port was placed in the right upper quadrant and a 12 mm port was placed in the right lateral abdominal wall for laparoscopic assistance. All ports were placed under direct vision without difficulty. The surgical cart was then docked.   Utilizing the cautery scissors, the bladder was reflected posteriorly allowing entry into the space of Retzius and identification of the endopelvic fascia and prostate. During this dissection, a small cystotomy was noted in the anterior bladder.  This was small and was closed with a 3-0 figure of eight vicryl in two layers with a mucosal layer and a second imbricating layer.  The periprostatic fat was then removed from the prostate allowing full exposure of the endopelvic fascia. The endopelvic fascia was then incised from the apex back to the base of the prostate bilaterally and the underlying levator muscle fibers were swept laterally off the prostate thereby isolating the dorsal venous complex. The dorsal vein was then stapled and divided with a 45 mm Flex Echelon stapler. Attention then turned to the bladder neck which was divided anteriorly thereby allowing entry into the bladder and exposure of the urethral catheter. The catheter balloon was deflated and the catheter was brought into the operative field and used to retract the prostate anteriorly. The posterior bladder neck was then examined and was divided allowing further dissection between the bladder and prostate posteriorly until the vasa deferentia and seminal vessels were identified. The vasa deferentia were isolated, divided, and lifted anteriorly. The seminal vesicles were dissected down to their tips with care to control the seminal vascular  arterial blood supply. These structures were then lifted anteriorly and the space between Denonvillier's fascia  and the anterior rectum was developed with a combination of sharp and blunt dissection. This isolated the vascular pedicles of the prostate.  The lateral prostatic fascia was then sharply incised allowing release of the neurovascular bundles bilaterally. The vascular pedicles of the prostate were then ligated with Weck clips between the prostate and neurovascular bundles and divided with sharp cold scissor dissection resulting in neurovascular bundle preservation. The neurovascular bundles were then separated off the apex of the prostate and urethra bilaterally.  The urethra was then sharply transected allowing the prostate specimen to be disarticulated. The pelvis was copiously irrigated and hemostasis was ensured. There was no evidence for rectal injury.  Attention then turned to the urethral anastomosis. A 2-0 Vicryl slip knot was placed between Denonvillier's fascia, the posterior bladder neck, and the posterior urethra to reapproximate these structures. A double-armed 3-0 Monocryl suture was then used to perform a 360 running tension-free anastomosis between the bladder neck and urethra. A new urethral catheter was then placed into the bladder and irrigated. There were no blood clots within the bladder and the anastomosis appeared to be watertight. The bladder was distended under pressure with no leak from the anterior cystotomy site.  A #19 Blake drain was then brought through the left lateral 8 mm port site and positioned appropriately within the pelvis. It was secured to the skin with a nylon suture. The surgical cart was then undocked. The right lateral 12 mm port site was closed at the fascial level with a 0 Vicryl suture placed laparoscopically. All remaining ports were then removed under direct vision. The prostate specimen was removed intact within the Endopouch retrieval bag via the periumbilical camera port site. This fascial opening was closed with two running 0 Vicryl sutures. 0.25% Marcaine  was then injected into all port sites and all incisions were reapproximated at the skin level with 4-0 Monocryl subcuticular sutures and Dermabond. The patient appeared to tolerate the procedure well and without complications. The patient was able to be extubated and transferred to the recovery unit in satisfactory condition.  Pryor Curia MD

## 2015-01-26 NOTE — Transfer of Care (Signed)
Immediate Anesthesia Transfer of Care Note  Patient: Bobby Schroeder  Procedure(s) Performed: Procedure(s): ROBOTIC ASSISTED LAPAROSCOPIC RADICAL PROSTATECTOMY LEVEL 1 (N/A)  Patient Location: PACU  Anesthesia Type:GENERAL  Level of Consciousness:  sedated, patient cooperative and responds to stimulation  Airway & Oxygen Therapy:Patient Spontanous Breathing and Patient connected to face mask oxgen  Post-op Assessment:  Report given to PACU RN and Post -op Vital signs reviewed and stable  Post vital signs:  Reviewed and stable  Last Vitals:  Filed Vitals:   01/26/15 0533  BP: 147/83  Pulse: 88  Temp: 36.6 C  Resp: 18    Complications: No apparent anesthesia complications

## 2015-01-26 NOTE — Discharge Instructions (Signed)

## 2015-01-27 ENCOUNTER — Encounter (HOSPITAL_COMMUNITY): Payer: Self-pay | Admitting: Urology

## 2015-01-27 DIAGNOSIS — C61 Malignant neoplasm of prostate: Secondary | ICD-10-CM | POA: Diagnosis not present

## 2015-01-27 LAB — HEMOGLOBIN AND HEMATOCRIT, BLOOD
HEMATOCRIT: 35.4 % — AB (ref 39.0–52.0)
Hemoglobin: 12.1 g/dL — ABNORMAL LOW (ref 13.0–17.0)

## 2015-01-27 MED ORDER — HYDROCODONE-ACETAMINOPHEN 5-325 MG PO TABS: 1.0000 | ORAL_TABLET | Freq: Four times a day (QID) | ORAL | Status: DC | PRN

## 2015-01-27 MED ORDER — BISACODYL 10 MG RE SUPP
10.0000 mg | Freq: Once | RECTAL | Status: AC
  Administered 2015-01-27: 10 mg via RECTAL
  Filled 2015-01-27: qty 1

## 2015-01-27 NOTE — Progress Notes (Signed)
1 Day Post-Op Subjective: The patient is doing well.  No nausea or vomiting. Pain is adequately controlled. Receiving some IV pain medication. No flatus or BM but feeling some rumbles.  Objective: Vital signs in last 24 hours: Temp:  [97.7 F (36.5 C)-98.3 F (36.8 C)] 98.1 F (36.7 C) (01/10 [id number]) Pulse Rate:  [64-100] 64 (01/10 0613) Resp:  [8-19] 12 (01/10 0613) BP: (103-143)/(57-78) 109/63 mmHg (01/10 0613) SpO2:  [97 %-100 %] 98 % (01/10 0613)  Intake/Output from previous day: 01/09 0701 - 01/10 0700 In: 4712.5 [P.O.:440; I.V.:4272.5] Out: 2430 [Urine:2210; Drains:145; Blood:75] Intake/Output this shift: Total I/O In: 2312.5 [P.O.:240; I.V.:2072.5] Out: 1995 [id number]; Drains:45]  Physical Exam:  General: Alert and oriented. CV: RRR Lungs: Clear bilaterally. GI: Soft, Nondistended. Incisions: Clean, dry, and intact Urine: Clear JP: serosanguinous 145 total output/10 since MN Extremities: Nontender, no erythema, no edema.  Lab Results:  Recent Labs  01/26/15 1055 01/27/15 0530  HGB 13.0 12.1*  HCT 39.4 35.4*      Assessment/Plan: POD# 1 s/p robotic prostatectomy.  1) SL IVF 2) Ambulate, Incentive spirometry 3) Transition to oral pain medication 4) Dulcolax suppository 5) D/C pelvic drain, will discuss sending fluid for creatinine prior 6) Plan for likely discharge later today    LOS: 1 day   Star Age 01/27/2015, 6:48 AM

## 2015-01-28 NOTE — Anesthesia Postprocedure Evaluation (Signed)
Anesthesia Post Note  Patient: Bobby Schroeder  Procedure(s) Performed: Procedure(s) (LRB): ROBOTIC ASSISTED LAPAROSCOPIC RADICAL PROSTATECTOMY LEVEL 1 (N/A)  Patient location during evaluation: PACU Anesthesia Type: General Level of consciousness: awake and alert Pain management: pain level controlled Vital Signs Assessment: post-procedure vital signs reviewed and stable Respiratory status: spontaneous breathing Cardiovascular status: blood pressure returned to baseline Anesthetic complications: no    Last Vitals:  Filed Vitals:   01/27/15 0146 01/27/15 0613  BP: 103/57 109/63  Pulse: 72 64  Temp: 36.7 C 36.7 C  Resp: 14 12    Last Pain:  Filed Vitals:   01/27/15 0804  PainSc: 0-No pain                 Tiajuana Amass

## 2015-11-10 ENCOUNTER — Other Ambulatory Visit: Payer: Self-pay | Admitting: Dermatology

## 2016-07-18 ENCOUNTER — Other Ambulatory Visit: Payer: Self-pay | Admitting: Dermatology

## 2017-09-23 IMAGING — CR DG CHEST 2V
2 series · 2 of 2 positions shown · non-contrast
Comparison: None.

CLINICAL DATA: Preoperative evaluation. Prostate surgery dated
01/26/2015.

EXAM:
CHEST  2 VIEW

[w chest pa]
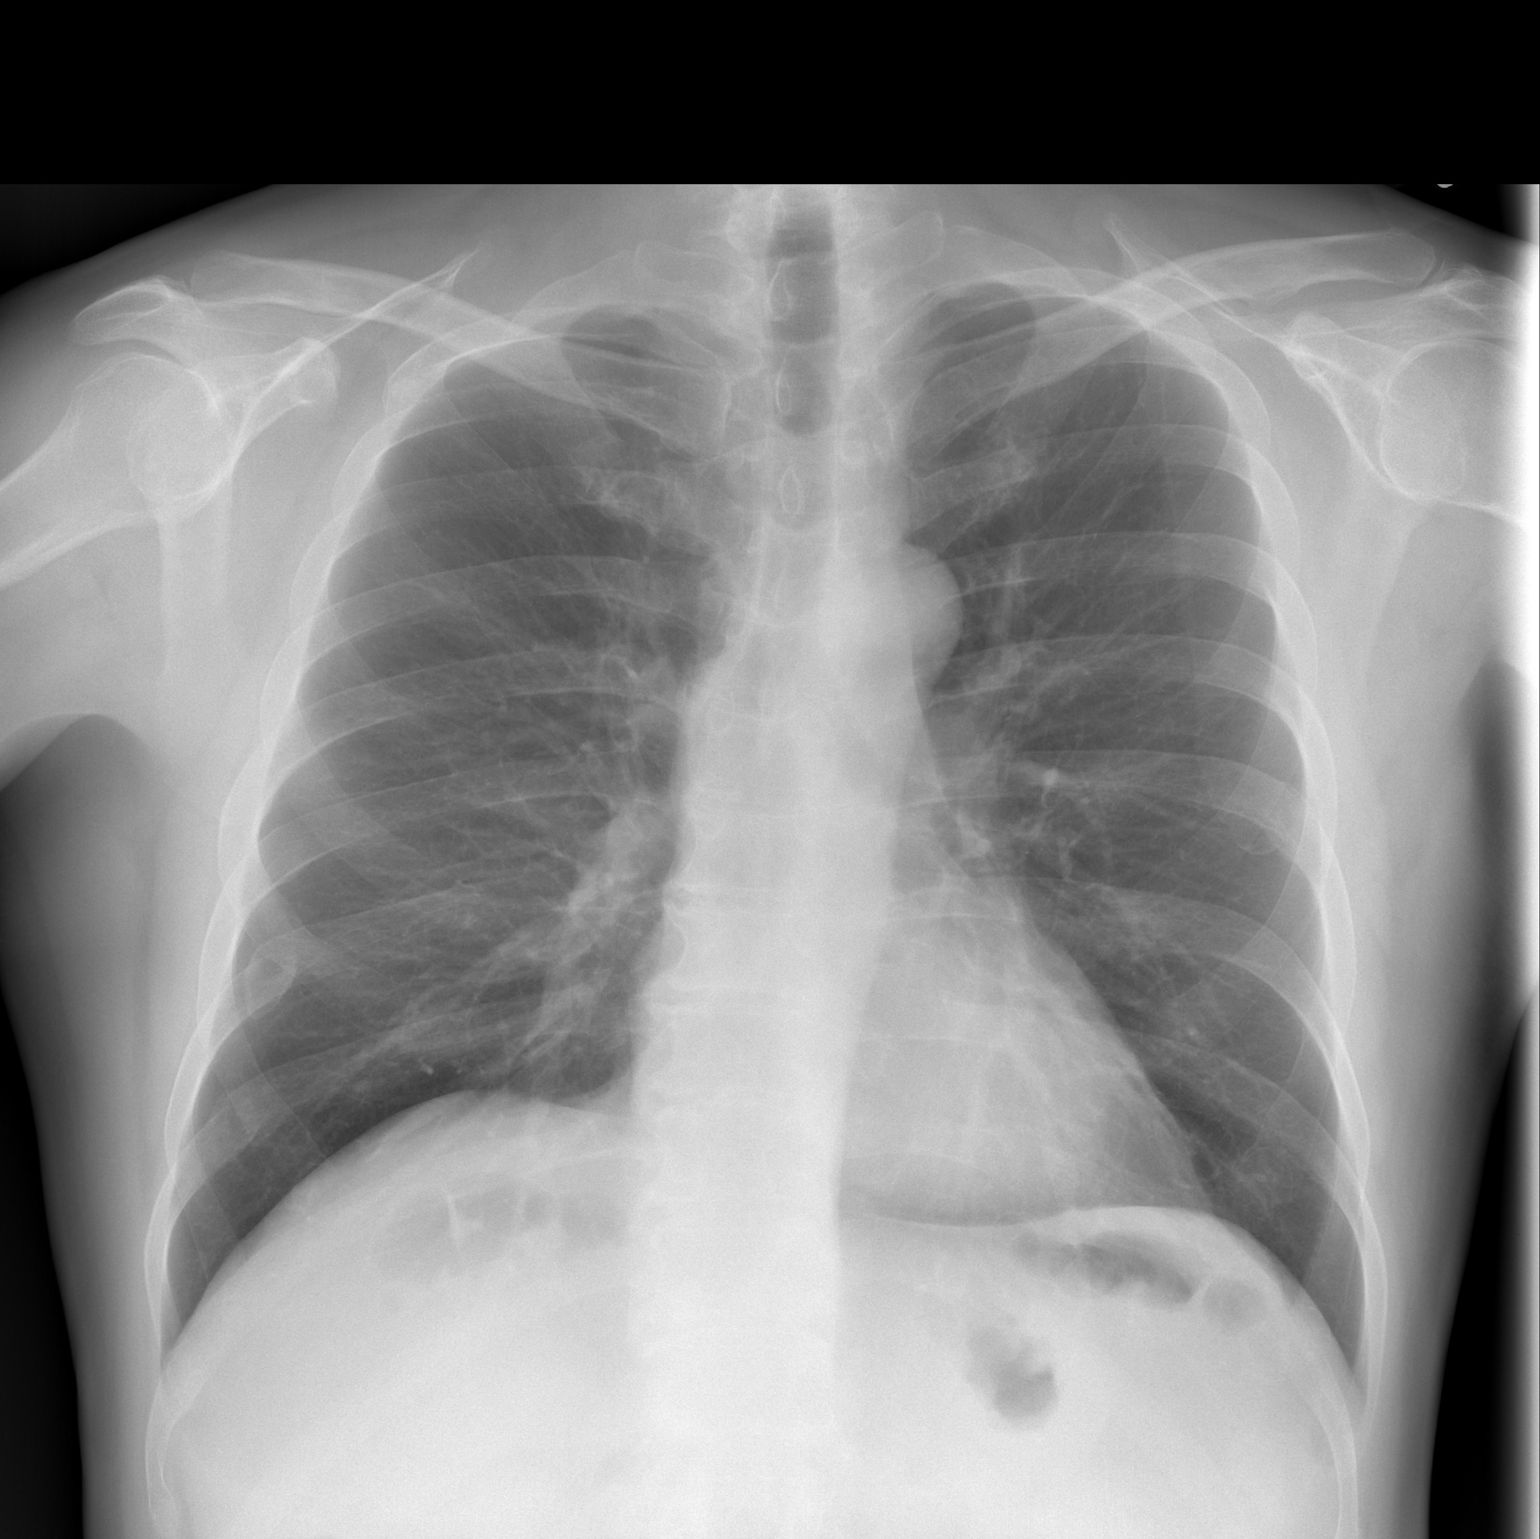

[w chest lat]
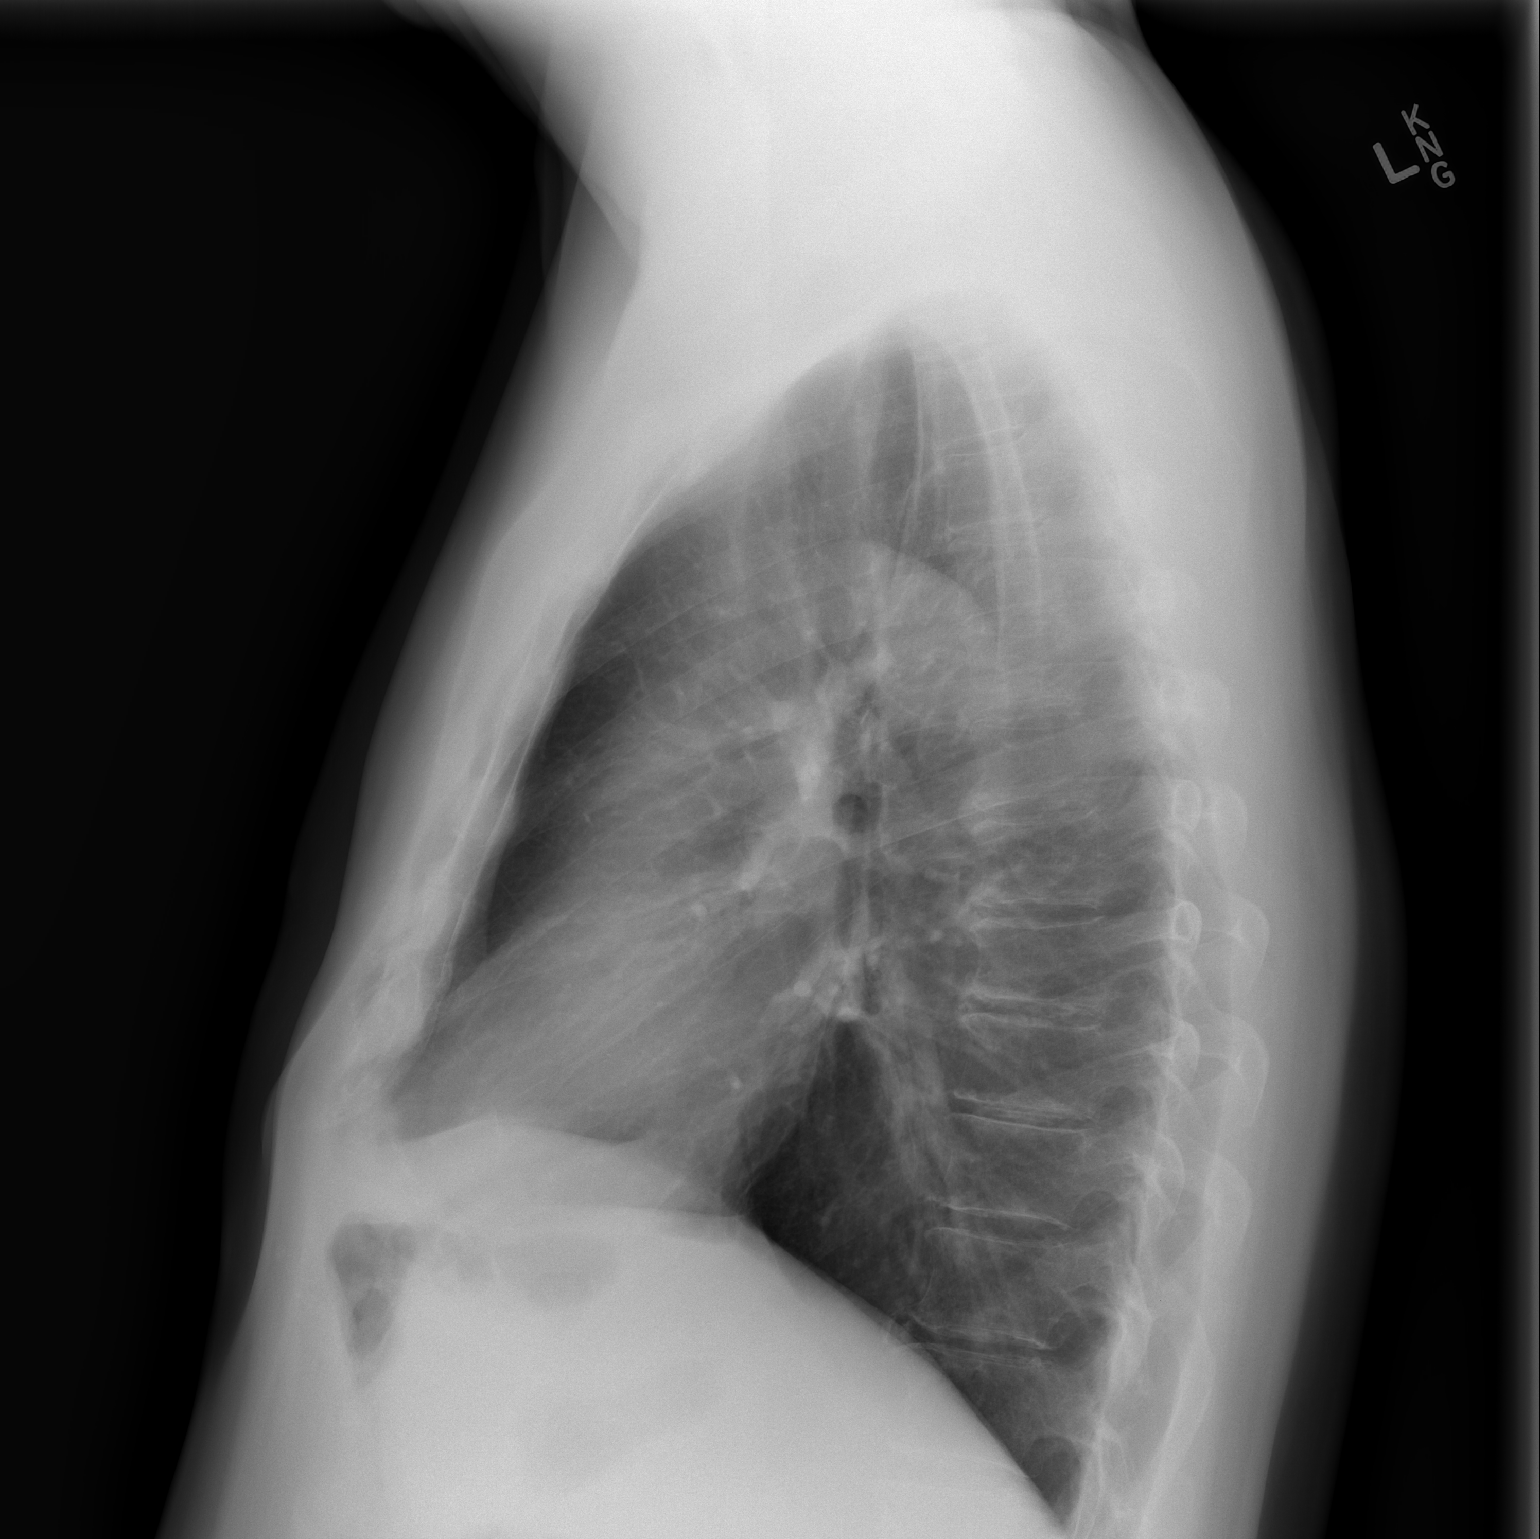

[2 of 2 positions shown; findings below may reference images not displayed]

FINDINGS: Normal cardiac and mediastinal contours. No consolidative pulmonary
opacities. No pleural effusion or pneumothorax. Mid thoracic spine
degenerative changes. Posterior right eighth and ninth chronic rib
deformities.
IMPRESSION: No active cardiopulmonary disease.

## 2018-07-31 ENCOUNTER — Encounter: Payer: Self-pay | Admitting: *Deleted

## 2018-12-05 ENCOUNTER — Other Ambulatory Visit: Payer: Self-pay | Admitting: Dermatology

## 2020-02-26 ENCOUNTER — Ambulatory Visit (INDEPENDENT_AMBULATORY_CARE_PROVIDER_SITE_OTHER): Payer: Medicare Other | Admitting: Dermatology

## 2020-02-26 ENCOUNTER — Encounter: Payer: Self-pay | Admitting: Dermatology

## 2020-02-26 ENCOUNTER — Other Ambulatory Visit: Payer: Self-pay

## 2020-02-26 DIAGNOSIS — L820 Inflamed seborrheic keratosis: Secondary | ICD-10-CM | POA: Diagnosis not present

## 2020-02-26 DIAGNOSIS — L281 Prurigo nodularis: Secondary | ICD-10-CM

## 2020-02-26 DIAGNOSIS — Z1283 Encounter for screening for malignant neoplasm of skin: Secondary | ICD-10-CM

## 2020-02-26 DIAGNOSIS — Z87898 Personal history of other specified conditions: Secondary | ICD-10-CM

## 2020-02-26 DIAGNOSIS — L570 Actinic keratosis: Secondary | ICD-10-CM | POA: Diagnosis not present

## 2020-02-26 DIAGNOSIS — Z86018 Personal history of other benign neoplasm: Secondary | ICD-10-CM | POA: Diagnosis not present

## 2020-02-26 DIAGNOSIS — L821 Other seborrheic keratosis: Secondary | ICD-10-CM | POA: Diagnosis not present

## 2020-03-03 ENCOUNTER — Encounter: Payer: Self-pay | Admitting: Dermatology

## 2020-03-03 NOTE — Progress Notes (Signed)
   Follow-Up Visit   Subjective  Bobby Schroeder is a 70 y.o. male who presents for the following: Annual Exam ([place]).  General skin examination Location: New crusts frontal scalp and forehead Duration:  Quality:  Associated Signs/Symptoms: Modifying Factors:  Severity:  Timing: Context: History of skin cancer  Objective  Well appearing patient in no apparent distress; mood and affect are within normal limits. Objective  Mid Back: White scars, no sign recurrence  Objective  Chest - Medial [facility]): Waist up skin examination no atypical moles, melanoma, new or recurrent nonmole skin cancer.  Objective  Left Lower Back: Flattopped brown textured 5 mm papule  Objective  Mid Forehead (2): Moderately thick pink hornlike crusts  Objective  Mid Frontal Scalp: For millimeter lichenified excoriated papule compatible with solitary pickers nodule  Objective  Left Forehead: Inflamed 5 mm pink crust.    All skin waist up examined.   Assessment & Plan    History of atypical nevus Mid Back  Yearly skin check  Screening exam for skin cancer Chest - Medial ([facility])  Yearly skin check  Seborrheic keratosis Left Lower Back  Okay to leave if stable  AK (actinic keratosis) (2) Mid Forehead  Destruction of lesion - Mid Forehead Complexity: simple   Destruction method: cryotherapy   Informed consent: discussed and consent obtained   Timeout:  patient name, date of birth, surgical site, and procedure verified Lesion destroyed using liquid nitrogen: Yes   Cryotherapy cycles:  3 Outcome: patient tolerated procedure well with no complications    Prurigo nodularis Mid Frontal Scalp  Etiology of this common problem discussed with Bobby Schroeder.  We did a 5-second liquid nitrogen freeze to see if this can both peel off the top of the lesion and act as an anti-inflammatory.  Destruction of lesion - Mid Frontal Scalp Complexity: simple    Destruction method: cryotherapy   Informed consent: discussed and consent obtained   Timeout:  patient name, date of birth, surgical site, and procedure verified Lesion destroyed using liquid nitrogen: Yes   Cryotherapy cycles:  3 Outcome: patient tolerated procedure well with no complications    Seborrheic keratosis, inflamed Left Forehead  Destruction of lesion - Left Forehead Complexity: simple   Destruction method: cryotherapy   Informed consent: discussed and consent obtained   Timeout:  patient name, date of birth, surgical site, and procedure verified Lesion destroyed using liquid nitrogen: Yes   Cryotherapy cycles:  3 Outcome: patient tolerated procedure well with no complications    After the visit I spent some time with friendly conversation with Bobby Schroeder whom I am known for many years.  I do not recall him previously mentioning of the loss of his son several years ago, leaving behind 2 young grandchildren.  I expressed my sincere condolences

## 2021-03-01 ENCOUNTER — Encounter: Payer: Self-pay | Admitting: Dermatology

## 2021-03-01 ENCOUNTER — Other Ambulatory Visit: Payer: Self-pay

## 2021-03-01 ENCOUNTER — Ambulatory Visit: Payer: Medicare HMO | Admitting: Dermatology

## 2021-03-01 DIAGNOSIS — M7130 Other bursal cyst, unspecified site: Secondary | ICD-10-CM

## 2021-03-01 DIAGNOSIS — M71372 Other bursal cyst, left ankle and foot: Secondary | ICD-10-CM

## 2021-03-01 DIAGNOSIS — L820 Inflamed seborrheic keratosis: Secondary | ICD-10-CM

## 2021-03-01 DIAGNOSIS — L821 Other seborrheic keratosis: Secondary | ICD-10-CM

## 2021-03-01 DIAGNOSIS — Z86018 Personal history of other benign neoplasm: Secondary | ICD-10-CM | POA: Diagnosis not present

## 2021-03-04 ENCOUNTER — Encounter: Payer: Self-pay | Admitting: Dermatology

## 2021-03-04 NOTE — Progress Notes (Signed)
° °  Follow-Up Visit   Subjective  Bobby Schroeder is a 71 y.o. male who presents for the following: Annual Exam (Lesion on left upper arm x 2 weeks. Lesions on leg x months. Personal history of atypical moles. ).  Several spots of concern have grown H Location:  Duration:  Quality:  Associated Signs/Symptoms: Modifying Factors:  Severity:  Timing: Context:   Objective  Well appearing patient in no apparent distress; mood and affect are within normal limits. Left 2nd Distal Dorsal Toe Deep firm 2 mm vesicular or pseudo vesicular papule distal to PIP joint.  Nontender.  Left Thigh - Posterior, Left Upper Arm - Posterior, Left Zygomatic Area, Right Lower Back, Right Upper Back (3) Pink-tan to light brown inflamed flattopped keratotic papules with typical dermoscopy    All skin waist up examined.  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  May leave if stable and asymptomatic  Inflamed seborrheic keratosis (7) Left Upper Arm - Posterior; Left Thigh - Posterior; Right Upper Back (3); Right Lower Back; Left Zygomatic Area  Destruction of lesion - Left Upper Arm - Posterior, Right Lower Back, Right Upper Back Complexity: simple   Destruction method: cryotherapy   Informed consent: discussed and consent obtained   Timeout:  patient name, date of birth, surgical site, and procedure verified Lesion destroyed using liquid nitrogen: Yes   Cryotherapy cycles:  3 Outcome: patient tolerated procedure well with no complications   Post-procedure details: wound care instructions given        I, Lavonna Monarch, MD, have reviewed all documentation for this visit.  The documentation on 03/04/21 for the exam, diagnosis, procedures, and orders are all accurate and complete.

## 2022-03-01 ENCOUNTER — Ambulatory Visit: Payer: Medicare HMO | Admitting: Dermatology

## 2022-08-05 ENCOUNTER — Other Ambulatory Visit (HOSPITAL_COMMUNITY): Payer: Self-pay | Admitting: Rehabilitation

## 2022-08-05 DIAGNOSIS — M5116 Intervertebral disc disorders with radiculopathy, lumbar region: Secondary | ICD-10-CM

## 2022-08-05 DIAGNOSIS — G834 Cauda equina syndrome: Secondary | ICD-10-CM

## 2022-08-06 ENCOUNTER — Encounter (HOSPITAL_COMMUNITY): Payer: Self-pay

## 2022-08-06 ENCOUNTER — Ambulatory Visit (HOSPITAL_COMMUNITY): Payer: Medicare HMO

## 2022-08-09 ENCOUNTER — Ambulatory Visit (HOSPITAL_COMMUNITY): Admission: RE | Admit: 2022-08-09 | Payer: Medicare HMO | Source: Ambulatory Visit

## 2022-08-09 ENCOUNTER — Ambulatory Visit (HOSPITAL_COMMUNITY)
Admission: RE | Admit: 2022-08-09 | Discharge: 2022-08-09 | Disposition: A | Discharge status: Home / Self Care | Payer: Medicare HMO | Source: Ambulatory Visit | Attending: Rehabilitation | Admitting: Rehabilitation

## 2022-08-09 DIAGNOSIS — M5116 Intervertebral disc disorders with radiculopathy, lumbar region: Secondary | ICD-10-CM | POA: Insufficient documentation

## 2022-08-09 DIAGNOSIS — G834 Cauda equina syndrome: Secondary | ICD-10-CM | POA: Insufficient documentation

## 2023-07-31 ENCOUNTER — Other Ambulatory Visit (HOSPITAL_COMMUNITY): Payer: Self-pay | Admitting: Urology

## 2023-07-31 DIAGNOSIS — C61 Malignant neoplasm of prostate: Secondary | ICD-10-CM

## 2023-08-11 ENCOUNTER — Encounter (HOSPITAL_COMMUNITY)
Admission: RE | Admit: 2023-08-11 | Discharge: 2023-08-11 | Disposition: A | Discharge status: Home / Self Care | Source: Ambulatory Visit | Attending: Urology | Admitting: Urology

## 2023-08-11 ENCOUNTER — Encounter (HOSPITAL_COMMUNITY): Admission: RE | Admit: 2023-08-11 | Source: Ambulatory Visit

## 2023-08-11 ENCOUNTER — Encounter (HOSPITAL_COMMUNITY): Payer: Self-pay

## 2023-08-11 DIAGNOSIS — C61 Malignant neoplasm of prostate: Secondary | ICD-10-CM | POA: Diagnosis present

## 2023-08-11 MED ORDER — FLOTUFOLASTAT F 18 GALLIUM 296-5846 MBQ/ML IV SOLN
6.3000 | Freq: Once | INTRAVENOUS | Status: AC
  Administered 2023-08-11: 6.3 via INTRAVENOUS

## 2023-08-24 NOTE — Progress Notes (Signed)
 GU Location of Tumor / Histology: Prostate Ca  Radical Prostatectomy (2017)  PSA  0.31 on 07/19/2023 PSA  0.25 on 04/24/2023 PSA  0.19 on 02/09/2023  Sharolyn LITTIE Penton presented as referral from Dr.Lester [facility]) elevated PSA.  08/11/2023 Dr. Gretel Ferrara NM PET ([facility]) Skull to Mid Thigh CLINICAL DATA:  Prostate carcinoma with biochemical recurrence.  Radical prostatectomy.  Past/Anticipated interventions by urology, if any:     Past/Anticipated interventions by medical oncology, if any: NA  Weight changes, if any:  No  IPSS:  5 SHIM:  5  Bowel/Bladder complaints, if any:  Constipation takes laxative as needed.   Bladder urine appears to be foamy at the end of urination.  Has history of kidney stones.  Nausea/Vomiting, if any: No  Pain issues, if any:  0/10  SAFETY ISSUES: Prior radiation?  No Pacemaker/ICD? No Possible current pregnancy? Male Is the patient on methotrexate? No  Current Complaints / other details:  None  30 minutes spent total, including time for meaningful use questions, reviewing medication, as well as spent in face-to-face time in nurse evaluation with the patient.

## 2023-08-29 ENCOUNTER — Ambulatory Visit
Admission: RE | Admit: 2023-08-29 | Discharge: 2023-08-29 | Disposition: A | Discharge status: Home / Self Care | Source: Ambulatory Visit | Attending: Radiation Oncology | Admitting: Radiation Oncology

## 2023-08-29 ENCOUNTER — Encounter: Payer: Self-pay | Admitting: Radiation Oncology

## 2023-08-29 VITALS — BP 147/75 | HR 64 | Resp 18 | Ht 68.0 in | Wt 167.8 lb

## 2023-08-29 DIAGNOSIS — N189 Chronic kidney disease, unspecified: Secondary | ICD-10-CM | POA: Insufficient documentation

## 2023-08-29 DIAGNOSIS — R011 Cardiac murmur, unspecified: Secondary | ICD-10-CM | POA: Diagnosis not present

## 2023-08-29 DIAGNOSIS — I129 Hypertensive chronic kidney disease with stage 1 through stage 4 chronic kidney disease, or unspecified chronic kidney disease: Secondary | ICD-10-CM | POA: Diagnosis not present

## 2023-08-29 DIAGNOSIS — R9721 Rising PSA following treatment for malignant neoplasm of prostate: Secondary | ICD-10-CM

## 2023-08-29 DIAGNOSIS — C61 Malignant neoplasm of prostate: Secondary | ICD-10-CM | POA: Insufficient documentation

## 2023-08-29 DIAGNOSIS — K219 Gastro-esophageal reflux disease without esophagitis: Secondary | ICD-10-CM | POA: Diagnosis not present

## 2023-08-29 DIAGNOSIS — Z79899 Other long term (current) drug therapy: Secondary | ICD-10-CM | POA: Diagnosis not present

## 2023-08-29 DIAGNOSIS — I700 Atherosclerosis of aorta: Secondary | ICD-10-CM | POA: Insufficient documentation

## 2023-08-29 NOTE — Progress Notes (Signed)
 Radiation Oncology         (336) [phone] ________________________________  Initial Outpatient Consultation  Name: Bobby Schroeder MRN: [id number]  Date: 08/29/2023  DOB: Jul 10, 1950  RR:Xpemznd, Mabel, MD  Renda Glance, MD   REFERRING PHYSICIAN: Renda Glance, MD  DIAGNOSIS: 73 y.o. gentleman with a biochemical recurrence of prostate cancer as evidenced by rising, detectable postoperative PSA of 0.31 s/p RALP 01/2015 for Stage pT2cNxMx, Gleason 3+4 prostate cancer.    ICD-10-CM   1. Biochemically recurrent malignant neoplasm of prostate [facility])  C61    R97.21       HISTORY OF PRESENT ILLNESS: Bobby Schroeder is a 73 y.o. male with a diagnosis of biochemically recurrent prostate cancer. He was initially referred to Dr. Jewel for an elevated PSA of 6.04. He was subsequently diagnosed with Gleason 3+3 prostate cancer involving 7 of 12 cores on biopsy performed on 10/28/14. He met with Dr. Johney to discuss radiotherapy options and with Dr. Renda in consult on 12/23/14 to discuss surgical options and ultimately elected to proceed with RALP on 01/26/15 under the care of Dr. Renda. Final surgical pathology revealed Gleason 3+4 prostatic adenocarcinoma with a positive margin in the right anterolateral near the bladder neck. Extraprostatic extension could not be confirmed at that site. Of note, no lymph nodes were biopsied. His postoperative PSA was undetectable.  His PSA remained undetectable through 2022 before rising to 0.1 in 05/2021. His PSA then stabilized at 0.2 in 06/2022 but rose again to 0.25 in 04/2023 and to 0.31 in 07/2023. This prompted a PSMA PET scan for restaging that was performed on 08/11/23 and shows no evidence macroscopic disease. Incidentally noted was mild activity to subacute fractures of the right transverse process at L2 and L3, favored benign, posttraumatic nonspecific activity.  The patient reviewed the PSA and imaging results with his urologist and he has kindly been  referred today for discussion of potential salvage radiation treatment options. He is accompanied by his wife, Bobby Schroeder, for today's visit.    PREVIOUS RADIATION THERAPY: No  PAST MEDICAL HISTORY:  Past Medical History:  Diagnosis Date   Atypical nevus 02/12/2001   right mid back-slight/mod. NO TX   Atypical nevus 02/12/2001   left upper arm-moderate WIDER SHAVE   Atypical nevus 07/16/2003   right front ribcage-slight NO TX   Atypical nevus x 3 10/20/2003   left lower back, center upper back, right lower calf,post- all slight -moderate ALL TREATED WITH WIDER SHAVE   Cancer (HCC)    prostate   Chronic kidney disease    GERD (gastroesophageal reflux disease)    Heart murmur    hx of as teenager   Hypertension       PAST SURGICAL HISTORY: Past Surgical History:  Procedure Laterality Date   cyst removed on left side of shoulder     lithotripsy x 3     ROBOT ASSISTED LAPAROSCOPIC RADICAL PROSTATECTOMY N/A 01/26/2015   Procedure: ROBOTIC ASSISTED LAPAROSCOPIC RADICAL PROSTATECTOMY LEVEL 1;  Surgeon: Glance Renda, MD;  Location: [facility];  Service: Urology;  Laterality: N/A;   VASECTOMY  1981    FAMILY HISTORY: History reviewed. No pertinent family history.  SOCIAL HISTORY:  Social History   Socioeconomic History   Marital status: Married    Spouse name: Not on file   Number of children: Not on file   Years of education: Not on file   Highest education level: Not on file  Occupational History   Not on file  Tobacco Use  Smoking status: Never   Smokeless tobacco: Never  Vaping Use   Vaping status: Never Used  Substance and Sexual Activity   Alcohol use: Yes    Comment: rarely   Drug use: No   Sexual activity: Not on file  Other Topics Concern   Not on file  Social History Narrative   Not on file   Social Drivers of Health   Financial Resource Strain: Low Risk  (07/20/2023)   Received from [facility]   Overall Financial Resource Strain (CARDIA)    Difficulty of  Paying Living Expenses: Not hard at all  Food Insecurity: No Food Insecurity (08/29/2023)   Hunger Vital Sign    Worried About Running Out of Food in the Last Year: Never true    Ran Out of Food in the Last Year: Never true  Transportation Needs: No Transportation Needs (08/29/2023)   PRAPARE - Administrator, Civil Service (Medical): No    Lack of Transportation (Non-Medical): No  Physical Activity: Insufficiently Active (07/20/2023)   Received from [facility]   Exercise Vital Sign    On average, how many days per week do you engage in moderate to strenuous exercise (like a brisk walk)?: 4 days    On average, how many minutes do you engage in exercise at this level?: 30 min  Stress: No Stress Concern Present (07/20/2023)   Received from [facility] - Occupational Stress Questionnaire    Feeling of Stress : Not at all  Social Connections: Socially Integrated (07/20/2023)   Received from [facility]   Social Network    How would you rate your social network (family, work, friends)?: Good participation with social networks  Intimate Partner Violence: Not At Risk (08/29/2023)   Humiliation, Afraid, Rape, and Kick questionnaire    Fear of Current or Ex-Partner: No    Emotionally Abused: No    Physically Abused: No    Sexually Abused: No    ALLERGIES: Patient has no known allergies.  MEDICATIONS:  Current Outpatient Medications  Medication Sig Dispense Refill   escitalopram (LEXAPRO) 10 MG tablet Take 10 mg by mouth.     allopurinol  (ZYLOPRIM ) 300 MG tablet Take 300 mg by mouth daily.     atorvastatin (LIPITOR) 10 MG tablet Take 10 mg by mouth daily.     gabapentin (NEURONTIN) 600 MG tablet Take 600 mg by mouth 2 (two) times daily.     HYDROcodone -acetaminophen  (NORCO/VICODIN) 5-325 MG tablet Take 1-2 tablets by mouth every 6 (six) hours as needed. 25 tablet 0   triamterene-hydrochlorothiazide (MAXZIDE-25) 37.5-25 MG tablet Take 1  tablet by mouth daily.     No current facility-administered medications for this encounter.    REVIEW OF SYSTEMS:  On review of systems, the patient reports that he is doing well overall. He denies any chest pain, shortness of breath, cough, fevers, chills, night sweats, unintended weight changes. He denies any bowel disturbances, and denies abdominal pain, nausea or vomiting. He denies any new musculoskeletal or joint aches or pains. His IPSS was 5, indicating mild urinary symptoms. His SHIM was 5, indicating he has severe postoperative erectile dysfunction. A complete review of systems is obtained and is otherwise negative.    PHYSICAL EXAM:  Wt Readings from Last 3 Encounters:  08/29/23 167 lb 12.8 oz (76.1 kg)  01/26/15 171 lb (77.6 kg)  01/14/15 171 lb (77.6 kg)   Temp Readings from Last 3 Encounters:  01/27/15 98.1  F (36.7 C) (Oral)  01/14/15 97.8 F (36.6 C) (Oral)   BP Readings from Last 3 Encounters:  08/29/23 (!) 147/75  01/27/15 109/63  01/14/15 (!) 141/79   Pulse Readings from Last 3 Encounters:  08/29/23 64  01/27/15 64  01/14/15 63    /10  In general this is a well appearing Caucasian man in no acute distress. He's alert and oriented x4 and appropriate throughout the examination. Cardiopulmonary assessment is negative for acute distress, and he exhibits normal effort.     KPS = 100  100 - Normal; no complaints; no evidence of disease. 90   - Able to carry on normal activity; minor signs or symptoms of disease. 80   - Normal activity with effort; some signs or symptoms of disease. 42   - Cares for self; unable to carry on normal activity or to do active work. 60   - Requires occasional assistance, but is able to care for most of his personal needs. 50   - Requires considerable assistance and frequent medical care. 40   - Disabled; requires special care and assistance. 30   - Severely disabled; hospital admission is indicated although death not imminent. 20    - Very sick; hospital admission necessary; active supportive treatment necessary. 10   - Moribund; fatal processes progressing rapidly. 0     - Dead  Karnofsky DA, Abelmann WH, Craver LS and Burchenal [facility] [phone]) The use of the nitrogen mustards in the palliative treatment of carcinoma: with particular reference to bronchogenic carcinoma Cancer 1 [phone]  LABORATORY DATA:  Lab Results  Component Value Date   WBC 4.3 01/14/2015   HGB 12.1 (L) 01/27/2015   HCT 35.4 (L) 01/27/2015   MCV 91.2 01/14/2015   PLT 168 01/14/2015   Lab Results  Component Value Date   NA 142 01/14/2015   K 4.0 01/14/2015   CL 105 01/14/2015   CO2 28 01/14/2015   No results found for: ALT, AST, GGT, ALKPHOS, BILITOT   RADIOGRAPHY: NM PET (PSMA) SKULL TO MID THIGH Result Date: 08/14/2023 CLINICAL DATA:  Prostate carcinoma with biochemical recurrence. Radical prostatectomy. EXAM: NUCLEAR MEDICINE PET SKULL BASE TO THIGH TECHNIQUE: 6.3 mCi Flotufolastat (Posluma ) was injected intravenously. Full-ring PET imaging was performed from the skull base to thigh after the radiotracer. CT data was obtained and used for attenuation correction and anatomic localization. COMPARISON:  None Available. FINDINGS: NECK No radiotracer activity in neck lymph nodes. Incidental CT finding: None. CHEST No radiotracer accumulation within mediastinal or hilar lymph nodes. No suspicious pulmonary nodules on the CT scan. Incidental CT finding: None. ABDOMEN/PELVIS Prostate: No focal activity in the prostatectomy bed. Lymph nodes: No abnormal radiotracer accumulation within pelvic or abdominal nodes. Liver: No evidence of liver metastasis. Incidental CT finding: Large benign RIGHT renal cyst measures 5.7 cm. Atherosclerotic calcification of the aorta. SKELETON Fractures of the RIGHT transverse process at L2 and L3 (image 134 and image 143). There is mild radiotracer activity associated with these fractures. IMPRESSION: 1. No evidence of local  prostate carcinoma recurrence in the prostatectomy bed. 2. No evidence of metastatic adenopathy in the pelvis or periaortic retroperitoneum. 3. No evidence of visceral metastasis. 4. Subacute fractures of the RIGHT transverse process at L2 and L3. Mild activity is favored benign posttraumatic nonspecific activity. 5.  Aortic Atherosclerosis (ICD10-I70.0). Electronically Signed   By: Jackquline Boxer M.D.   On: 08/14/2023 08:43      IMPRESSION/PLAN: 1. 73 y.o. gentleman with a biochemical recurrence of  prostate cancer as evidenced by rising, detectable postoperative PSA of 0.31 s/p RALP 01/2015 for Stage pT2cNxMx, Gleason 3+4 prostate cancer. Today we reviewed the findings and workup thus far.  We discussed the natural history of prostate cancer.  We reviewed the the implications of positive margins, extracapsular extension, and seminal vesicle involvement on the risk of prostate cancer recurrence. In his case, a positive margin and potential extraprostatic extension were present and he now has a rising, detectable postoperative PSA at 0.31. We reviewed some of the evidence suggesting an advantage for patients who undergo salvage radiotherapy in this setting, in terms of disease control and overall survival. We discussed radiation treatment directed to the prostatic fossa and pelvic lymph nodes with regard to the logistics and delivery of external beam radiation treatment.  At the conclusion of our conversation, the patient is interested in moving forward with the recommended 7.5 week course of daily salvage external beam therapy. We will share our discussion with Dr. Renda. The patient appears to have a good understanding of his disease and our treatment recommendations which are of curative intent and is in agreement with the stated plan.  We did discuss the option of meeting with Dr. Penne Nam at [facility]iation clinic in [place], [place] since he lives in [place], [place]  but the patient  prefers to have his treatments here in [place]. Therefore, we will move forward with treatment planning accordingly, in anticipation of beginning IMRT in the near future.  He has freely signed written consent to proceed today in the office and a copy of this document will be placed in his medical record.  He is tentatively scheduled for CT simulation at 2 PM on Wednesday, 08/30/2023 so we will look forward to continuing to participate in his care.  He and his wife know they are welcome to call at anytime with any questions or concerns in the interim.   We personally spent 70 minutes in this encounter including chart review, reviewing radiological studies, meeting face-to-face with the patient, entering orders and completing documentation.    Sabra MICAEL Rusk, PA-C    [name], MD  [facility]  Radiation Oncology Direct Dial: [phone]  Fax: [phone] [email or web address]  Skype  LinkedIn   This document serves as a record of services personally performed by [name], MD and Sabra Rusk, PA-C. It was created on their behalf by Izetta Neither, a trained medical scribe. The creation of this record is based on the scribe's personal observations and the provider's statements to them. This document has been checked and approved by the attending provider.

## 2023-08-30 ENCOUNTER — Ambulatory Visit
Admission: RE | Admit: 2023-08-30 | Discharge: 2023-08-30 | Disposition: A | Discharge status: Home / Self Care | Source: Ambulatory Visit | Attending: Radiation Oncology | Admitting: Radiation Oncology

## 2023-08-30 DIAGNOSIS — Z510 Encounter for antineoplastic radiation therapy: Secondary | ICD-10-CM | POA: Insufficient documentation

## 2023-08-30 DIAGNOSIS — R9721 Rising PSA following treatment for malignant neoplasm of prostate: Secondary | ICD-10-CM

## 2023-08-30 DIAGNOSIS — C61 Malignant neoplasm of prostate: Secondary | ICD-10-CM | POA: Diagnosis present

## 2023-08-30 NOTE — Progress Notes (Signed)
  Radiation Oncology         (336) [phone] ________________________________  Name: Bobby Schroeder MRN: [id number]  Date: 08/30/2023  DOB: [birth year]  SIMULATION AND TREATMENT PLANNING NOTE    ICD-10-CM   1. Biochemically recurrent malignant neoplasm of prostate [facility])  C61    R97.21       DIAGNOSIS:  73 y.o. gentleman with a biochemical recurrence of prostate cancer as evidenced by rising, detectable postoperative PSA of 0.31 s/p RALP 01/2015 for Stage pT2cNxMx, Gleason 3+4 prostate cancer.   NARRATIVE:  The patient was brought to the CT Simulation planning suite.  Identity was confirmed.  All relevant records and images related to the planned course of therapy were reviewed.  The patient freely provided informed written consent to proceed with treatment after reviewing the details related to the planned course of therapy. The consent form was witnessed and verified by the simulation staff.  Then, the patient was set-up in a stable reproducible supine position for radiation therapy.  A vacuum lock pillow device was custom fabricated to position his legs in a reproducible immobilized position.  Then, I performed a urethrogram under sterile conditions to identify the prostatic apex.  CT images were obtained.  Surface markings were placed.  The CT images were loaded into the planning software.  Then the prostate target and avoidance structures including the rectum, bladder, bowel and hips were contoured.  Treatment planning then occurred.  The radiation prescription was entered and confirmed.  A total of one complex treatment devices was fabricated. I have requested : Intensity Modulated Radiotherapy (IMRT) is medically necessary for this case for the following reason:  Rectal sparing.SABRA  PLAN:   The prostate fossa and pelvic lymph nodes will initially be treated to 45 Gy in 25 fractions of 1.8 Gy followed by a boost to the fossa only, to 68.4 Gy with 13 additional fractions of 1.8 Gy    ________________________________  Donnice FELIX Patrcia, M.D.

## 2023-08-31 NOTE — Progress Notes (Signed)
 Introduced myself to the patient as the prostate nurse navigator.  No barriers to care identified at this time.  He is here to discuss his radiation treatment options.  I gave him my business card and asked him to call me with questions or concerns.  Verbalized understanding.  ?

## 2023-09-01 DIAGNOSIS — Z510 Encounter for antineoplastic radiation therapy: Secondary | ICD-10-CM | POA: Diagnosis not present

## 2023-09-12 ENCOUNTER — Other Ambulatory Visit: Payer: Self-pay

## 2023-09-12 ENCOUNTER — Ambulatory Visit

## 2023-09-12 ENCOUNTER — Ambulatory Visit: Admitting: Radiation Oncology

## 2023-09-12 ENCOUNTER — Ambulatory Visit
Admission: RE | Admit: 2023-09-12 | Discharge: 2023-09-12 | Disposition: A | Discharge status: Home / Self Care | Source: Ambulatory Visit | Attending: Radiation Oncology

## 2023-09-12 DIAGNOSIS — Z510 Encounter for antineoplastic radiation therapy: Secondary | ICD-10-CM | POA: Diagnosis not present

## 2023-09-12 LAB — RAD ONC ARIA SESSION SUMMARY
Course Elapsed Days: 0
Plan Fractions Treated to Date: 1
Plan Prescribed Dose Per Fraction: 1.8 Gy
Plan Total Fractions Prescribed: 25
Plan Total Prescribed Dose: 45 Gy
Reference Point Dosage Given to Date: 1.8 Gy
Reference Point Session Dosage Given: 1.8 Gy
Session Number: 1

## 2023-09-13 ENCOUNTER — Other Ambulatory Visit: Payer: Self-pay

## 2023-09-13 ENCOUNTER — Ambulatory Visit
Admission: RE | Admit: 2023-09-13 | Discharge: 2023-09-13 | Disposition: A | Discharge status: Home / Self Care | Source: Ambulatory Visit | Attending: Radiation Oncology

## 2023-09-13 DIAGNOSIS — Z510 Encounter for antineoplastic radiation therapy: Secondary | ICD-10-CM | POA: Diagnosis not present

## 2023-09-13 LAB — RAD ONC ARIA SESSION SUMMARY
Course Elapsed Days: 1
Plan Fractions Treated to Date: 2
Plan Prescribed Dose Per Fraction: 1.8 Gy
Plan Total Fractions Prescribed: 25
Plan Total Prescribed Dose: 45 Gy
Reference Point Dosage Given to Date: 3.6 Gy
Reference Point Session Dosage Given: 1.8 Gy
Session Number: 2

## 2023-09-14 ENCOUNTER — Ambulatory Visit
Admission: RE | Admit: 2023-09-14 | Discharge: 2023-09-14 | Disposition: A | Discharge status: Home / Self Care | Source: Ambulatory Visit | Attending: Radiation Oncology

## 2023-09-14 ENCOUNTER — Other Ambulatory Visit: Payer: Self-pay

## 2023-09-14 DIAGNOSIS — Z510 Encounter for antineoplastic radiation therapy: Secondary | ICD-10-CM | POA: Diagnosis not present

## 2023-09-14 LAB — RAD ONC ARIA SESSION SUMMARY
Course Elapsed Days: 2
Plan Fractions Treated to Date: 3
Plan Prescribed Dose Per Fraction: 1.8 Gy
Plan Total Fractions Prescribed: 25
Plan Total Prescribed Dose: 45 Gy
Reference Point Dosage Given to Date: 5.4 Gy
Reference Point Session Dosage Given: 1.8 Gy
Session Number: 3

## 2023-09-15 ENCOUNTER — Ambulatory Visit
Admission: RE | Admit: 2023-09-15 | Discharge: 2023-09-15 | Disposition: A | Discharge status: Home / Self Care | Source: Ambulatory Visit | Attending: Radiation Oncology | Admitting: Radiation Oncology

## 2023-09-15 ENCOUNTER — Other Ambulatory Visit: Payer: Self-pay

## 2023-09-15 DIAGNOSIS — Z510 Encounter for antineoplastic radiation therapy: Secondary | ICD-10-CM | POA: Diagnosis not present

## 2023-09-15 LAB — RAD ONC ARIA SESSION SUMMARY
Course Elapsed Days: 3
Plan Fractions Treated to Date: 4
Plan Prescribed Dose Per Fraction: 1.8 Gy
Plan Total Fractions Prescribed: 25
Plan Total Prescribed Dose: 45 Gy
Reference Point Dosage Given to Date: 7.2 Gy
Reference Point Session Dosage Given: 1.8 Gy
Session Number: 4

## 2023-09-19 ENCOUNTER — Other Ambulatory Visit: Payer: Self-pay

## 2023-09-19 ENCOUNTER — Ambulatory Visit
Admission: RE | Admit: 2023-09-19 | Discharge: 2023-09-19 | Disposition: A | Discharge status: Home / Self Care | Source: Ambulatory Visit | Attending: Radiation Oncology | Admitting: Radiation Oncology

## 2023-09-19 DIAGNOSIS — C61 Malignant neoplasm of prostate: Secondary | ICD-10-CM | POA: Diagnosis present

## 2023-09-19 DIAGNOSIS — Z510 Encounter for antineoplastic radiation therapy: Secondary | ICD-10-CM | POA: Insufficient documentation

## 2023-09-19 LAB — RAD ONC ARIA SESSION SUMMARY
Course Elapsed Days: 7
Plan Fractions Treated to Date: 5
Plan Prescribed Dose Per Fraction: 1.8 Gy
Plan Total Fractions Prescribed: 25
Plan Total Prescribed Dose: 45 Gy
Reference Point Dosage Given to Date: 9 Gy
Reference Point Session Dosage Given: 1.8 Gy
Session Number: 5

## 2023-09-20 ENCOUNTER — Other Ambulatory Visit: Payer: Self-pay

## 2023-09-20 ENCOUNTER — Ambulatory Visit
Admission: RE | Admit: 2023-09-20 | Discharge: 2023-09-20 | Disposition: A | Discharge status: Home / Self Care | Source: Ambulatory Visit | Attending: Radiation Oncology

## 2023-09-20 DIAGNOSIS — Z510 Encounter for antineoplastic radiation therapy: Secondary | ICD-10-CM | POA: Diagnosis not present

## 2023-09-20 LAB — RAD ONC ARIA SESSION SUMMARY
Course Elapsed Days: 8
Plan Fractions Treated to Date: 6
Plan Prescribed Dose Per Fraction: 1.8 Gy
Plan Total Fractions Prescribed: 25
Plan Total Prescribed Dose: 45 Gy
Reference Point Dosage Given to Date: 10.8 Gy
Reference Point Session Dosage Given: 1.8 Gy
Session Number: 6

## 2023-09-21 ENCOUNTER — Ambulatory Visit
Admission: RE | Admit: 2023-09-21 | Discharge: 2023-09-21 | Disposition: A | Discharge status: Home / Self Care | Source: Ambulatory Visit | Attending: Radiation Oncology | Admitting: Radiation Oncology

## 2023-09-21 ENCOUNTER — Other Ambulatory Visit: Payer: Self-pay

## 2023-09-21 DIAGNOSIS — Z510 Encounter for antineoplastic radiation therapy: Secondary | ICD-10-CM | POA: Diagnosis not present

## 2023-09-21 LAB — RAD ONC ARIA SESSION SUMMARY
Course Elapsed Days: 9
Plan Fractions Treated to Date: 7
Plan Prescribed Dose Per Fraction: 1.8 Gy
Plan Total Fractions Prescribed: 25
Plan Total Prescribed Dose: 45 Gy
Reference Point Dosage Given to Date: 12.6 Gy
Reference Point Session Dosage Given: 1.8 Gy
Session Number: 7

## 2023-09-22 ENCOUNTER — Ambulatory Visit
Admission: RE | Admit: 2023-09-22 | Discharge: 2023-09-22 | Disposition: A | Discharge status: Home / Self Care | Source: Ambulatory Visit | Attending: Radiation Oncology | Admitting: Radiation Oncology

## 2023-09-22 ENCOUNTER — Other Ambulatory Visit: Payer: Self-pay

## 2023-09-22 DIAGNOSIS — Z510 Encounter for antineoplastic radiation therapy: Secondary | ICD-10-CM | POA: Diagnosis not present

## 2023-09-22 LAB — RAD ONC ARIA SESSION SUMMARY
Course Elapsed Days: 10
Plan Fractions Treated to Date: 8
Plan Prescribed Dose Per Fraction: 1.8 Gy
Plan Total Fractions Prescribed: 25
Plan Total Prescribed Dose: 45 Gy
Reference Point Dosage Given to Date: 14.4 Gy
Reference Point Session Dosage Given: 1.8 Gy
Session Number: 8

## 2023-09-25 ENCOUNTER — Other Ambulatory Visit: Payer: Self-pay

## 2023-09-25 ENCOUNTER — Ambulatory Visit
Admission: RE | Admit: 2023-09-25 | Discharge: 2023-09-25 | Disposition: A | Discharge status: Home / Self Care | Source: Ambulatory Visit | Attending: Radiation Oncology

## 2023-09-25 DIAGNOSIS — Z510 Encounter for antineoplastic radiation therapy: Secondary | ICD-10-CM | POA: Diagnosis not present

## 2023-09-25 LAB — RAD ONC ARIA SESSION SUMMARY
Course Elapsed Days: 13
Plan Fractions Treated to Date: 9
Plan Prescribed Dose Per Fraction: 1.8 Gy
Plan Total Fractions Prescribed: 25
Plan Total Prescribed Dose: 45 Gy
Reference Point Dosage Given to Date: 16.2 Gy
Reference Point Session Dosage Given: 1.8 Gy
Session Number: 9

## 2023-09-26 ENCOUNTER — Other Ambulatory Visit: Payer: Self-pay

## 2023-09-26 ENCOUNTER — Ambulatory Visit
Admission: RE | Admit: 2023-09-26 | Discharge: 2023-09-26 | Disposition: A | Discharge status: Home / Self Care | Source: Ambulatory Visit | Attending: Radiation Oncology | Admitting: Radiation Oncology

## 2023-09-26 DIAGNOSIS — Z510 Encounter for antineoplastic radiation therapy: Secondary | ICD-10-CM | POA: Diagnosis not present

## 2023-09-26 LAB — RAD ONC ARIA SESSION SUMMARY
Course Elapsed Days: 14
Plan Fractions Treated to Date: 10
Plan Prescribed Dose Per Fraction: 1.8 Gy
Plan Total Fractions Prescribed: 25
Plan Total Prescribed Dose: 45 Gy
Reference Point Dosage Given to Date: 18 Gy
Reference Point Session Dosage Given: 1.8 Gy
Session Number: 10

## 2023-09-27 ENCOUNTER — Ambulatory Visit
Admission: RE | Admit: 2023-09-27 | Discharge: 2023-09-27 | Disposition: A | Discharge status: Home / Self Care | Source: Ambulatory Visit | Attending: Radiation Oncology | Admitting: Radiation Oncology

## 2023-09-27 ENCOUNTER — Other Ambulatory Visit: Payer: Self-pay

## 2023-09-27 DIAGNOSIS — Z510 Encounter for antineoplastic radiation therapy: Secondary | ICD-10-CM | POA: Diagnosis not present

## 2023-09-27 LAB — RAD ONC ARIA SESSION SUMMARY
Course Elapsed Days: 15
Plan Fractions Treated to Date: 11
Plan Prescribed Dose Per Fraction: 1.8 Gy
Plan Total Fractions Prescribed: 25
Plan Total Prescribed Dose: 45 Gy
Reference Point Dosage Given to Date: 19.8 Gy
Reference Point Session Dosage Given: 1.8 Gy
Session Number: 11

## 2023-09-28 ENCOUNTER — Ambulatory Visit
Admission: RE | Admit: 2023-09-28 | Discharge: 2023-09-28 | Disposition: A | Discharge status: Home / Self Care | Source: Ambulatory Visit | Attending: Radiation Oncology

## 2023-09-28 ENCOUNTER — Other Ambulatory Visit: Payer: Self-pay

## 2023-09-28 ENCOUNTER — Ambulatory Visit
Admission: RE | Admit: 2023-09-28 | Discharge: 2023-09-28 | Discharge status: Home / Self Care | Source: Ambulatory Visit | Attending: Radiation Oncology

## 2023-09-28 DIAGNOSIS — Z510 Encounter for antineoplastic radiation therapy: Secondary | ICD-10-CM | POA: Diagnosis not present

## 2023-09-28 LAB — RAD ONC ARIA SESSION SUMMARY
Course Elapsed Days: 16
Plan Fractions Treated to Date: 12
Plan Prescribed Dose Per Fraction: 1.8 Gy
Plan Total Fractions Prescribed: 25
Plan Total Prescribed Dose: 45 Gy
Reference Point Dosage Given to Date: 21.6 Gy
Reference Point Session Dosage Given: 1.8 Gy
Session Number: 12

## 2023-09-29 ENCOUNTER — Other Ambulatory Visit: Payer: Self-pay

## 2023-09-29 ENCOUNTER — Ambulatory Visit

## 2023-09-29 ENCOUNTER — Ambulatory Visit
Admission: RE | Admit: 2023-09-29 | Discharge: 2023-09-29 | Disposition: A | Discharge status: Home / Self Care | Source: Ambulatory Visit | Attending: Radiation Oncology | Admitting: Radiation Oncology

## 2023-09-29 DIAGNOSIS — Z510 Encounter for antineoplastic radiation therapy: Secondary | ICD-10-CM | POA: Diagnosis not present

## 2023-09-29 LAB — RAD ONC ARIA SESSION SUMMARY
Course Elapsed Days: 17
Plan Fractions Treated to Date: 13
Plan Prescribed Dose Per Fraction: 1.8 Gy
Plan Total Fractions Prescribed: 25
Plan Total Prescribed Dose: 45 Gy
Reference Point Dosage Given to Date: 23.4 Gy
Reference Point Session Dosage Given: 1.8 Gy
Session Number: 13

## 2023-10-02 ENCOUNTER — Ambulatory Visit
Admission: RE | Admit: 2023-10-02 | Discharge: 2023-10-02 | Disposition: A | Discharge status: Home / Self Care | Source: Ambulatory Visit | Attending: Radiation Oncology | Admitting: Radiation Oncology

## 2023-10-02 ENCOUNTER — Other Ambulatory Visit: Payer: Self-pay

## 2023-10-02 DIAGNOSIS — Z510 Encounter for antineoplastic radiation therapy: Secondary | ICD-10-CM | POA: Diagnosis not present

## 2023-10-02 LAB — RAD ONC ARIA SESSION SUMMARY
Course Elapsed Days: 20
Plan Fractions Treated to Date: 14
Plan Prescribed Dose Per Fraction: 1.8 Gy
Plan Total Fractions Prescribed: 25
Plan Total Prescribed Dose: 45 Gy
Reference Point Dosage Given to Date: 25.2 Gy
Reference Point Session Dosage Given: 1.8 Gy
Session Number: 14

## 2023-10-03 ENCOUNTER — Other Ambulatory Visit: Payer: Self-pay

## 2023-10-03 ENCOUNTER — Ambulatory Visit
Admission: RE | Admit: 2023-10-03 | Discharge: 2023-10-03 | Disposition: A | Discharge status: Home / Self Care | Source: Ambulatory Visit | Attending: Radiation Oncology | Admitting: Radiation Oncology

## 2023-10-03 DIAGNOSIS — Z510 Encounter for antineoplastic radiation therapy: Secondary | ICD-10-CM | POA: Diagnosis not present

## 2023-10-03 LAB — RAD ONC ARIA SESSION SUMMARY
Course Elapsed Days: 21
Plan Fractions Treated to Date: 15
Plan Prescribed Dose Per Fraction: 1.8 Gy
Plan Total Fractions Prescribed: 25
Plan Total Prescribed Dose: 45 Gy
Reference Point Dosage Given to Date: 27 Gy
Reference Point Session Dosage Given: 1.8 Gy
Session Number: 15

## 2023-10-04 ENCOUNTER — Ambulatory Visit
Admission: RE | Admit: 2023-10-04 | Discharge: 2023-10-04 | Disposition: A | Discharge status: Home / Self Care | Source: Ambulatory Visit | Attending: Radiation Oncology | Admitting: Radiation Oncology

## 2023-10-04 ENCOUNTER — Other Ambulatory Visit: Payer: Self-pay

## 2023-10-04 DIAGNOSIS — Z510 Encounter for antineoplastic radiation therapy: Secondary | ICD-10-CM | POA: Diagnosis not present

## 2023-10-04 LAB — RAD ONC ARIA SESSION SUMMARY
Course Elapsed Days: 22
Plan Fractions Treated to Date: 16
Plan Prescribed Dose Per Fraction: 1.8 Gy
Plan Total Fractions Prescribed: 25
Plan Total Prescribed Dose: 45 Gy
Reference Point Dosage Given to Date: 28.8 Gy
Reference Point Session Dosage Given: 1.8 Gy
Session Number: 16

## 2023-10-05 ENCOUNTER — Ambulatory Visit
Admission: RE | Admit: 2023-10-05 | Discharge: 2023-10-05 | Disposition: A | Discharge status: Home / Self Care | Source: Ambulatory Visit | Attending: Radiation Oncology | Admitting: Radiation Oncology

## 2023-10-05 ENCOUNTER — Other Ambulatory Visit: Payer: Self-pay

## 2023-10-05 DIAGNOSIS — Z510 Encounter for antineoplastic radiation therapy: Secondary | ICD-10-CM | POA: Diagnosis not present

## 2023-10-05 LAB — RAD ONC ARIA SESSION SUMMARY
Course Elapsed Days: 23
Plan Fractions Treated to Date: 17
Plan Prescribed Dose Per Fraction: 1.8 Gy
Plan Total Fractions Prescribed: 25
Plan Total Prescribed Dose: 45 Gy
Reference Point Dosage Given to Date: 30.6 Gy
Reference Point Session Dosage Given: 1.8 Gy
Session Number: 17

## 2023-10-06 ENCOUNTER — Ambulatory Visit
Admission: RE | Admit: 2023-10-06 | Discharge: 2023-10-06 | Disposition: A | Discharge status: Home / Self Care | Source: Ambulatory Visit | Attending: Radiation Oncology | Admitting: Radiation Oncology

## 2023-10-06 ENCOUNTER — Other Ambulatory Visit: Payer: Self-pay

## 2023-10-06 DIAGNOSIS — Z510 Encounter for antineoplastic radiation therapy: Secondary | ICD-10-CM | POA: Diagnosis not present

## 2023-10-06 LAB — RAD ONC ARIA SESSION SUMMARY
Course Elapsed Days: 24
Plan Fractions Treated to Date: 18
Plan Prescribed Dose Per Fraction: 1.8 Gy
Plan Total Fractions Prescribed: 25
Plan Total Prescribed Dose: 45 Gy
Reference Point Dosage Given to Date: 32.4 Gy
Reference Point Session Dosage Given: 1.8 Gy
Session Number: 18

## 2023-10-09 ENCOUNTER — Ambulatory Visit
Admission: RE | Admit: 2023-10-09 | Discharge: 2023-10-09 | Disposition: A | Discharge status: Home / Self Care | Source: Ambulatory Visit | Attending: Radiation Oncology | Admitting: Radiation Oncology

## 2023-10-09 ENCOUNTER — Other Ambulatory Visit: Payer: Self-pay

## 2023-10-09 DIAGNOSIS — Z510 Encounter for antineoplastic radiation therapy: Secondary | ICD-10-CM | POA: Diagnosis not present

## 2023-10-09 LAB — RAD ONC ARIA SESSION SUMMARY
Course Elapsed Days: 27
Plan Fractions Treated to Date: 19
Plan Prescribed Dose Per Fraction: 1.8 Gy
Plan Total Fractions Prescribed: 25
Plan Total Prescribed Dose: 45 Gy
Reference Point Dosage Given to Date: 34.2 Gy
Reference Point Session Dosage Given: 1.8 Gy
Session Number: 19

## 2023-10-10 ENCOUNTER — Ambulatory Visit
Admission: RE | Admit: 2023-10-10 | Discharge: 2023-10-10 | Disposition: A | Discharge status: Home / Self Care | Source: Ambulatory Visit | Attending: Radiation Oncology | Admitting: Radiation Oncology

## 2023-10-10 ENCOUNTER — Other Ambulatory Visit: Payer: Self-pay

## 2023-10-10 DIAGNOSIS — Z510 Encounter for antineoplastic radiation therapy: Secondary | ICD-10-CM | POA: Diagnosis not present

## 2023-10-10 LAB — RAD ONC ARIA SESSION SUMMARY
Course Elapsed Days: 28
Plan Fractions Treated to Date: 20
Plan Prescribed Dose Per Fraction: 1.8 Gy
Plan Total Fractions Prescribed: 25
Plan Total Prescribed Dose: 45 Gy
Reference Point Dosage Given to Date: 36 Gy
Reference Point Session Dosage Given: 1.8 Gy
Session Number: 20

## 2023-10-11 ENCOUNTER — Ambulatory Visit
Admission: RE | Admit: 2023-10-11 | Discharge: 2023-10-11 | Disposition: A | Discharge status: Home / Self Care | Source: Ambulatory Visit | Attending: Radiation Oncology

## 2023-10-11 ENCOUNTER — Other Ambulatory Visit: Payer: Self-pay

## 2023-10-11 DIAGNOSIS — Z510 Encounter for antineoplastic radiation therapy: Secondary | ICD-10-CM | POA: Diagnosis not present

## 2023-10-11 LAB — RAD ONC ARIA SESSION SUMMARY
Course Elapsed Days: 29
Plan Fractions Treated to Date: 21
Plan Prescribed Dose Per Fraction: 1.8 Gy
Plan Total Fractions Prescribed: 25
Plan Total Prescribed Dose: 45 Gy
Reference Point Dosage Given to Date: 37.8 Gy
Reference Point Session Dosage Given: 1.8 Gy
Session Number: 21

## 2023-10-12 ENCOUNTER — Ambulatory Visit
Admission: RE | Admit: 2023-10-12 | Discharge: 2023-10-12 | Disposition: A | Discharge status: Home / Self Care | Source: Ambulatory Visit | Attending: Radiation Oncology

## 2023-10-12 ENCOUNTER — Other Ambulatory Visit: Payer: Self-pay

## 2023-10-12 ENCOUNTER — Ambulatory Visit
Admission: RE | Admit: 2023-10-12 | Discharge: 2023-10-12 | Disposition: A | Discharge status: Home / Self Care | Source: Ambulatory Visit | Attending: Radiation Oncology | Admitting: Radiation Oncology

## 2023-10-12 DIAGNOSIS — Z510 Encounter for antineoplastic radiation therapy: Secondary | ICD-10-CM | POA: Diagnosis not present

## 2023-10-12 LAB — RAD ONC ARIA SESSION SUMMARY
Course Elapsed Days: 30
Plan Fractions Treated to Date: 22
Plan Prescribed Dose Per Fraction: 1.8 Gy
Plan Total Fractions Prescribed: 25
Plan Total Prescribed Dose: 45 Gy
Reference Point Dosage Given to Date: 39.6 Gy
Reference Point Session Dosage Given: 1.8 Gy
Session Number: 22

## 2023-10-13 ENCOUNTER — Ambulatory Visit
Admission: RE | Admit: 2023-10-13 | Discharge: 2023-10-13 | Disposition: A | Discharge status: Home / Self Care | Source: Ambulatory Visit | Attending: Radiation Oncology | Admitting: Radiation Oncology

## 2023-10-13 ENCOUNTER — Other Ambulatory Visit: Payer: Self-pay

## 2023-10-13 DIAGNOSIS — Z510 Encounter for antineoplastic radiation therapy: Secondary | ICD-10-CM | POA: Diagnosis not present

## 2023-10-13 LAB — RAD ONC ARIA SESSION SUMMARY
Course Elapsed Days: 31
Plan Fractions Treated to Date: 23
Plan Prescribed Dose Per Fraction: 1.8 Gy
Plan Total Fractions Prescribed: 25
Plan Total Prescribed Dose: 45 Gy
Reference Point Dosage Given to Date: 41.4 Gy
Reference Point Session Dosage Given: 1.8 Gy
Session Number: 23

## 2023-10-16 ENCOUNTER — Ambulatory Visit
Admission: RE | Admit: 2023-10-16 | Discharge: 2023-10-16 | Disposition: A | Source: Ambulatory Visit | Attending: Radiation Oncology

## 2023-10-16 ENCOUNTER — Other Ambulatory Visit: Payer: Self-pay

## 2023-10-16 DIAGNOSIS — Z510 Encounter for antineoplastic radiation therapy: Secondary | ICD-10-CM | POA: Diagnosis not present

## 2023-10-16 LAB — RAD ONC ARIA SESSION SUMMARY
Course Elapsed Days: 34
Plan Fractions Treated to Date: 24
Plan Prescribed Dose Per Fraction: 1.8 Gy
Plan Total Fractions Prescribed: 25
Plan Total Prescribed Dose: 45 Gy
Reference Point Dosage Given to Date: 43.2 Gy
Reference Point Session Dosage Given: 1.8 Gy
Session Number: 24

## 2023-10-17 ENCOUNTER — Other Ambulatory Visit: Payer: Self-pay

## 2023-10-17 ENCOUNTER — Ambulatory Visit
Admission: RE | Admit: 2023-10-17 | Discharge: 2023-10-17 | Disposition: A | Source: Ambulatory Visit | Attending: Radiation Oncology

## 2023-10-17 DIAGNOSIS — Z510 Encounter for antineoplastic radiation therapy: Secondary | ICD-10-CM | POA: Diagnosis not present

## 2023-10-17 LAB — RAD ONC ARIA SESSION SUMMARY
Course Elapsed Days: 35
Plan Fractions Treated to Date: 25
Plan Prescribed Dose Per Fraction: 1.8 Gy
Plan Total Fractions Prescribed: 25
Plan Total Prescribed Dose: 45 Gy
Reference Point Dosage Given to Date: 45 Gy
Reference Point Session Dosage Given: 1.8 Gy
Session Number: 25

## 2023-10-18 ENCOUNTER — Ambulatory Visit
Admission: RE | Admit: 2023-10-18 | Discharge: 2023-10-18 | Disposition: A | Discharge status: Home / Self Care | Source: Ambulatory Visit | Attending: Radiation Oncology | Admitting: Radiation Oncology

## 2023-10-18 ENCOUNTER — Other Ambulatory Visit: Payer: Self-pay

## 2023-10-18 DIAGNOSIS — Z510 Encounter for antineoplastic radiation therapy: Secondary | ICD-10-CM | POA: Diagnosis present

## 2023-10-18 DIAGNOSIS — C61 Malignant neoplasm of prostate: Secondary | ICD-10-CM | POA: Diagnosis present

## 2023-10-18 LAB — RAD ONC ARIA SESSION SUMMARY
Course Elapsed Days: 36
Plan Fractions Treated to Date: 1
Plan Prescribed Dose Per Fraction: 1.8 Gy
Plan Total Fractions Prescribed: 13
Plan Total Prescribed Dose: 23.4 Gy
Reference Point Dosage Given to Date: 1.8 Gy
Reference Point Session Dosage Given: 1.8 Gy
Session Number: 26

## 2023-10-19 ENCOUNTER — Other Ambulatory Visit: Payer: Self-pay

## 2023-10-19 ENCOUNTER — Ambulatory Visit
Admission: RE | Admit: 2023-10-19 | Discharge: 2023-10-19 | Disposition: A | Discharge status: Home / Self Care | Source: Ambulatory Visit | Attending: Radiation Oncology | Admitting: Radiation Oncology

## 2023-10-19 DIAGNOSIS — Z510 Encounter for antineoplastic radiation therapy: Secondary | ICD-10-CM | POA: Diagnosis not present

## 2023-10-19 LAB — RAD ONC ARIA SESSION SUMMARY
Course Elapsed Days: 37
Plan Fractions Treated to Date: 2
Plan Prescribed Dose Per Fraction: 1.8 Gy
Plan Total Fractions Prescribed: 13
Plan Total Prescribed Dose: 23.4 Gy
Reference Point Dosage Given to Date: 3.6 Gy
Reference Point Session Dosage Given: 1.8 Gy
Session Number: 27

## 2023-10-20 ENCOUNTER — Ambulatory Visit
Admission: RE | Admit: 2023-10-20 | Discharge: 2023-10-20 | Disposition: A | Discharge status: Home / Self Care | Source: Ambulatory Visit | Attending: Radiation Oncology | Admitting: Radiation Oncology

## 2023-10-20 ENCOUNTER — Other Ambulatory Visit: Payer: Self-pay

## 2023-10-20 DIAGNOSIS — Z510 Encounter for antineoplastic radiation therapy: Secondary | ICD-10-CM | POA: Diagnosis not present

## 2023-10-20 LAB — RAD ONC ARIA SESSION SUMMARY
Course Elapsed Days: 38
Plan Fractions Treated to Date: 3
Plan Prescribed Dose Per Fraction: 1.8 Gy
Plan Total Fractions Prescribed: 13
Plan Total Prescribed Dose: 23.4 Gy
Reference Point Dosage Given to Date: 5.4 Gy
Reference Point Session Dosage Given: 1.8 Gy
Session Number: 28

## 2023-10-23 ENCOUNTER — Other Ambulatory Visit: Payer: Self-pay

## 2023-10-23 ENCOUNTER — Ambulatory Visit
Admission: RE | Admit: 2023-10-23 | Discharge: 2023-10-23 | Disposition: A | Source: Ambulatory Visit | Attending: Radiation Oncology

## 2023-10-23 DIAGNOSIS — Z510 Encounter for antineoplastic radiation therapy: Secondary | ICD-10-CM | POA: Diagnosis not present

## 2023-10-23 LAB — RAD ONC ARIA SESSION SUMMARY
Course Elapsed Days: 41
Plan Fractions Treated to Date: 4
Plan Prescribed Dose Per Fraction: 1.8 Gy
Plan Total Fractions Prescribed: 13
Plan Total Prescribed Dose: 23.4 Gy
Reference Point Dosage Given to Date: 7.2 Gy
Reference Point Session Dosage Given: 1.8 Gy
Session Number: 29

## 2023-10-24 ENCOUNTER — Other Ambulatory Visit: Payer: Self-pay

## 2023-10-24 ENCOUNTER — Ambulatory Visit
Admission: RE | Admit: 2023-10-24 | Discharge: 2023-10-24 | Disposition: A | Discharge status: Home / Self Care | Source: Ambulatory Visit | Attending: Radiation Oncology | Admitting: Radiation Oncology

## 2023-10-24 DIAGNOSIS — Z510 Encounter for antineoplastic radiation therapy: Secondary | ICD-10-CM | POA: Diagnosis not present

## 2023-10-24 LAB — RAD ONC ARIA SESSION SUMMARY
Course Elapsed Days: 42
Plan Fractions Treated to Date: 5
Plan Prescribed Dose Per Fraction: 1.8 Gy
Plan Total Fractions Prescribed: 13
Plan Total Prescribed Dose: 23.4 Gy
Reference Point Dosage Given to Date: 9 Gy
Reference Point Session Dosage Given: 1.8 Gy
Session Number: 30

## 2023-10-25 ENCOUNTER — Other Ambulatory Visit: Payer: Self-pay

## 2023-10-25 ENCOUNTER — Ambulatory Visit
Admission: RE | Admit: 2023-10-25 | Discharge: 2023-10-25 | Disposition: A | Discharge status: Home / Self Care | Source: Ambulatory Visit | Attending: Radiation Oncology | Admitting: Radiation Oncology

## 2023-10-25 DIAGNOSIS — Z510 Encounter for antineoplastic radiation therapy: Secondary | ICD-10-CM | POA: Diagnosis not present

## 2023-10-25 LAB — RAD ONC ARIA SESSION SUMMARY
Course Elapsed Days: 43
Plan Fractions Treated to Date: 6
Plan Prescribed Dose Per Fraction: 1.8 Gy
Plan Total Fractions Prescribed: 13
Plan Total Prescribed Dose: 23.4 Gy
Reference Point Dosage Given to Date: 10.8 Gy
Reference Point Session Dosage Given: 1.8 Gy
Session Number: 31

## 2023-10-26 ENCOUNTER — Other Ambulatory Visit: Payer: Self-pay

## 2023-10-26 ENCOUNTER — Ambulatory Visit
Admission: RE | Admit: 2023-10-26 | Discharge: 2023-10-26 | Disposition: A | Source: Ambulatory Visit | Attending: Radiation Oncology

## 2023-10-26 DIAGNOSIS — Z510 Encounter for antineoplastic radiation therapy: Secondary | ICD-10-CM | POA: Diagnosis not present

## 2023-10-26 LAB — RAD ONC ARIA SESSION SUMMARY
Course Elapsed Days: 44
Plan Fractions Treated to Date: 7
Plan Prescribed Dose Per Fraction: 1.8 Gy
Plan Total Fractions Prescribed: 13
Plan Total Prescribed Dose: 23.4 Gy
Reference Point Dosage Given to Date: 12.6 Gy
Reference Point Session Dosage Given: 1.8 Gy
Session Number: 32

## 2023-10-27 ENCOUNTER — Ambulatory Visit
Admission: RE | Admit: 2023-10-27 | Discharge: 2023-10-27 | Disposition: A | Discharge status: Home / Self Care | Source: Ambulatory Visit | Attending: Radiation Oncology | Admitting: Radiation Oncology

## 2023-10-27 ENCOUNTER — Other Ambulatory Visit: Payer: Self-pay

## 2023-10-27 DIAGNOSIS — Z510 Encounter for antineoplastic radiation therapy: Secondary | ICD-10-CM | POA: Diagnosis not present

## 2023-10-27 LAB — RAD ONC ARIA SESSION SUMMARY
Course Elapsed Days: 45
Plan Fractions Treated to Date: 8
Plan Prescribed Dose Per Fraction: 1.8 Gy
Plan Total Fractions Prescribed: 13
Plan Total Prescribed Dose: 23.4 Gy
Reference Point Dosage Given to Date: 14.4 Gy
Reference Point Session Dosage Given: 1.8 Gy
Session Number: 33

## 2023-10-30 ENCOUNTER — Ambulatory Visit
Admission: RE | Admit: 2023-10-30 | Discharge: 2023-10-30 | Disposition: A | Discharge status: Home / Self Care | Source: Ambulatory Visit | Attending: Radiation Oncology | Admitting: Radiation Oncology

## 2023-10-30 ENCOUNTER — Other Ambulatory Visit: Payer: Self-pay

## 2023-10-30 DIAGNOSIS — Z510 Encounter for antineoplastic radiation therapy: Secondary | ICD-10-CM | POA: Diagnosis not present

## 2023-10-30 LAB — RAD ONC ARIA SESSION SUMMARY
Course Elapsed Days: 48
Plan Fractions Treated to Date: 9
Plan Prescribed Dose Per Fraction: 1.8 Gy
Plan Total Fractions Prescribed: 13
Plan Total Prescribed Dose: 23.4 Gy
Reference Point Dosage Given to Date: 16.2 Gy
Reference Point Session Dosage Given: 1.8 Gy
Session Number: 34

## 2023-10-31 ENCOUNTER — Other Ambulatory Visit: Payer: Self-pay

## 2023-10-31 ENCOUNTER — Ambulatory Visit
Admission: RE | Admit: 2023-10-31 | Discharge: 2023-10-31 | Disposition: A | Source: Ambulatory Visit | Attending: Radiation Oncology

## 2023-10-31 DIAGNOSIS — Z510 Encounter for antineoplastic radiation therapy: Secondary | ICD-10-CM | POA: Diagnosis not present

## 2023-10-31 LAB — RAD ONC ARIA SESSION SUMMARY
Course Elapsed Days: 49
Plan Fractions Treated to Date: 10
Plan Prescribed Dose Per Fraction: 1.8 Gy
Plan Total Fractions Prescribed: 13
Plan Total Prescribed Dose: 23.4 Gy
Reference Point Dosage Given to Date: 18 Gy
Reference Point Session Dosage Given: 1.8 Gy
Session Number: 35

## 2023-11-01 ENCOUNTER — Other Ambulatory Visit: Payer: Self-pay

## 2023-11-01 ENCOUNTER — Ambulatory Visit
Admission: RE | Admit: 2023-11-01 | Discharge: 2023-11-01 | Disposition: A | Source: Ambulatory Visit | Attending: Radiation Oncology

## 2023-11-01 DIAGNOSIS — Z510 Encounter for antineoplastic radiation therapy: Secondary | ICD-10-CM | POA: Diagnosis not present

## 2023-11-01 LAB — RAD ONC ARIA SESSION SUMMARY
Course Elapsed Days: 50
Plan Fractions Treated to Date: 11
Plan Prescribed Dose Per Fraction: 1.8 Gy
Plan Total Fractions Prescribed: 13
Plan Total Prescribed Dose: 23.4 Gy
Reference Point Dosage Given to Date: 19.8 Gy
Reference Point Session Dosage Given: 1.8 Gy
Session Number: 36

## 2023-11-02 ENCOUNTER — Ambulatory Visit
Admission: RE | Admit: 2023-11-02 | Discharge: 2023-11-02 | Disposition: A | Discharge status: Home / Self Care | Source: Ambulatory Visit | Attending: Radiation Oncology | Admitting: Radiation Oncology

## 2023-11-02 ENCOUNTER — Other Ambulatory Visit: Payer: Self-pay

## 2023-11-02 DIAGNOSIS — Z510 Encounter for antineoplastic radiation therapy: Secondary | ICD-10-CM | POA: Diagnosis not present

## 2023-11-02 LAB — RAD ONC ARIA SESSION SUMMARY
Course Elapsed Days: 51
Plan Fractions Treated to Date: 12
Plan Prescribed Dose Per Fraction: 1.8 Gy
Plan Total Fractions Prescribed: 13
Plan Total Prescribed Dose: 23.4 Gy
Reference Point Dosage Given to Date: 21.6 Gy
Reference Point Session Dosage Given: 1.8 Gy
Session Number: 37

## 2023-11-03 ENCOUNTER — Ambulatory Visit
Admission: RE | Admit: 2023-11-03 | Discharge: 2023-11-03 | Disposition: A | Discharge status: Home / Self Care | Source: Ambulatory Visit | Attending: Radiation Oncology | Admitting: Radiation Oncology

## 2023-11-03 ENCOUNTER — Other Ambulatory Visit: Payer: Self-pay

## 2023-11-03 DIAGNOSIS — Z510 Encounter for antineoplastic radiation therapy: Secondary | ICD-10-CM | POA: Diagnosis not present

## 2023-11-03 DIAGNOSIS — R9721 Rising PSA following treatment for malignant neoplasm of prostate: Secondary | ICD-10-CM

## 2023-11-03 LAB — RAD ONC ARIA SESSION SUMMARY
Course Elapsed Days: 52
Plan Fractions Treated to Date: 13
Plan Prescribed Dose Per Fraction: 1.8 Gy
Plan Total Fractions Prescribed: 13
Plan Total Prescribed Dose: 23.4 Gy
Reference Point Dosage Given to Date: 23.4 Gy
Reference Point Session Dosage Given: 1.8 Gy
Session Number: 38

## 2023-11-05 NOTE — Radiation Completion Notes (Addendum)
  Radiation Oncology         [phone]) [phone] ________________________________  Name: Bobby Schroeder MRN: [id number]  Date: 11/03/2023  DOB: 1950/06/04  Referring Physician: NORETTA FERRARA, M.D. Date of Service: 2023-11-05 Radiation Oncologist: Adina Barge, M.D. [facility]     RADIATION ONCOLOGY END OF TREATMENT NOTE     Diagnosis:  73 y.o. gentleman with a biochemical recurrence of prostate cancer as evidenced by rising, detectable postoperative PSA of 0.31 s/p RALP 01/2015 for Stage pT2cNxMx, Gleason 3+4 prostate cancer.    Intent: Curative     ==========DELIVERED PLANS==========  First Treatment Date: 2023-09-12 Last Treatment Date: 2023-11-03   Plan Name: ProstBed_Pelv Site: Prostate Bed Technique: IMRT Mode: Photon Dose Per Fraction: 1.8 Gy Prescribed Dose (Delivered / Prescribed): 45 Gy / 45 Gy Prescribed Fxs (Delivered / Prescribed): 25 / 25   Plan Name: ProstBed_Bst Site: Prostate Bed Technique: IMRT Mode: Photon Dose Per Fraction: 1.8 Gy Prescribed Dose (Delivered / Prescribed): 23.4 Gy / 23.4 Gy Prescribed Fxs (Delivered / Prescribed): 13 / 13     ==========ON TREATMENT VISIT DATES========== 2023-09-15, 2023-09-22, 2023-09-28, 2023-10-06, 2023-10-12, 2023-10-20, 2023-10-27, 2023-11-03    See weekly On Treatment Notes in Epic for details in the Media tab (listed as Progress notes on the On Treatment Visit Dates listed above).  He tolerated the daily radiation treatments relatively well with some minor skin irritation, occasional loose stools and modest fatigue.  The patient will receive a call in about one month from the radiation oncology department. He will continue follow up with his urologist, Dr. Ferrara, as well.  ------------------------------------------------   [name], MD [facility]  Radiation Oncology Direct Dial: [phone]  Fax: [phone] [email or web address]  Skype  LinkedIn

## 2023-11-21 NOTE — Progress Notes (Addendum)
  Radiation Oncology         (336) [phone] ________________________________  Name: Bobby Schroeder MRN: [id number]  Date of Service: 12/05/2023  DOB: 10-26-1950  Post Treatment Telephone Note  Diagnosis:  Malignant neoplasm of prostate   First Treatment Date: 2023-09-12 Last Treatment Date: 2023-11-03   Plan Name: ProstBed_Pelv Site: Prostate Bed Technique: IMRT Mode: Photon Dose Per Fraction: 1.8 Gy Prescribed Dose (Delivered / Prescribed): 45 Gy / 45 Gy Prescribed Fxs (Delivered / Prescribed): 25 / 25   Plan Name: ProstBed_Bst Site: Prostate Bed Technique: IMRT Mode: Photon Dose Per Fraction: 1.8 Gy Prescribed Dose (Delivered / Prescribed): 23.4 Gy / 23.4 Gy Prescribed Fxs (Delivered / Prescribed): 13 / 13  Pre Treatment IPSS Score: 5 (as documented in the provider consult note)  The patient was available for call today.   Symptoms of fatigue have improved since completing therapy.  Symptoms of bladder changes have improved since completing therapy. .  Symptoms of bowel changes have improved since completing therapy. Current symptoms include rectal irritation, and medications for bowel symptoms include Destin/A & D ointment.   Post Treatment IPSS Score: IPSS Questionnaire (AUA-7): Over the past month.   1)  How often have you had a sensation of not emptying your bladder completely after you finish urinating?  1 - Less than 1 time in 5  2)  How often have you had to urinate again less than two hours after you finished urinating? 2 - Less than half the time  3)  How often have you found you stopped and started again several times when you urinated?  0 - Not at all  4) How difficult have you found it to postpone urination?  0 - Not at all  5) How often have you had a weak urinary stream?  4 - More than half the time  6) How often have you had to push or strain to begin urination?  0 - Not at all  7) How many times did you most typically get up to urinate from the time  you went to bed until the time you got up in the morning?  1 - 1 time  Total score:  8. Which indicates moderate symptoms  0-7 mildly symptomatic   8-19 moderately symptomatic   20-35 severely symptomatic   Patient has a scheduled follow up visit with his urologist, Dr. Renda, in January for ongoing surveillance. He was counseled that PSA levels will be drawn in the urology office, and was reassured that additional time is expected to improve bowel and bladder symptoms. He was encouraged to call back with concerns or questions regarding radiation.

## 2023-11-24 ENCOUNTER — Other Ambulatory Visit: Payer: Self-pay | Admitting: Urology

## 2023-11-24 DIAGNOSIS — C61 Malignant neoplasm of prostate: Secondary | ICD-10-CM

## 2023-11-28 ENCOUNTER — Telehealth: Payer: Self-pay | Admitting: Radiation Oncology

## 2023-11-28 NOTE — Telephone Encounter (Signed)
 Pt called asking for more details regarding upcoming appt. Pt was advised this is an informal call just to f/u and address any questions/concerns after completing tx. Pt asked about needing to f/u with Dr. Renda; pt was advised per PA Bruning's note that he should reach out to schedule f/u with them. Pt also advised he has had issues with burning near treatment site; he has tried OTC topical treatments but nothing is working, is there any rx that can help with this? Pt was forwarded to nurse [facility] for assistance with this.

## 2023-11-29 ENCOUNTER — Telehealth: Payer: Self-pay

## 2023-11-29 NOTE — Telephone Encounter (Signed)
 Mr. Londo,  diagnosed with Gleason 3+3 prostate cancer involving 7 of 12 cores on biopsy performed on 10/28/14.  Completed radiation treatment 11/03/2023.  RN returned call to patient had some complaints of burning, stinging, and itching to his rectal area.  He wanted to know if there was a prescription that will help since OTC medication aren't effective.  Denies bleeding, skin sloughing, and reports diarrhea has stopped.  RN advised patient to try preparation-H, wipe with baby wipes and Sitz baths to soothe area.  RN advised that it will take some time to heal, but side effects will eventually subside.  RN will inform Sabra Rusk, PA-C for further advice.

## 2023-11-29 NOTE — Telephone Encounter (Signed)
 RN returned call in reference to some complaints of rectal irritation.  Bobby Schroeder was advised to try hydrocortisone if that doesn't help than to try Cortisone 10 apply thin film to affected area only after cleaning the area first.    Bobby Schroeder was advised if still having discomfort to rectal area to go see PCP to r/o fungal infection per Ashlyn Bruning, PA-C.  Mr. Applegate verbalized understanding and thanks nurse for the call.

## 2023-12-05 ENCOUNTER — Ambulatory Visit
Admission: RE | Admit: 2023-12-05 | Discharge: 2023-12-05 | Disposition: A | Discharge status: Home / Self Care | Source: Ambulatory Visit | Attending: Radiation Oncology | Admitting: Radiation Oncology

## 2023-12-05 DIAGNOSIS — R9721 Rising PSA following treatment for malignant neoplasm of prostate: Secondary | ICD-10-CM

## 2023-12-25 NOTE — Progress Notes (Signed)
 Images pushed over into pacs from [facility]

## 2023-12-26 NOTE — Progress Notes (Signed)
 RN reached out to [facility] to coordinate for a PSA and MD follow up visit.  Staff will reach out to Dr. Renda to review for scheduling recommendations due to no current openings.  RN spoke with patient and he is aware.

## 2024-01-02 NOTE — Progress Notes (Signed)
 Patient is now scheduled for urology follow up with Dr. Renda on 01/19/2024 at 1:30 p.m.   RN left message with patient for call back to review.
